# Patient Record
Sex: Female | Born: 1950 | Race: White | Hispanic: No | Smoking: Never smoker
Health system: Southern US, Community
[De-identification: ages and names within clinical notes are randomized; demographics above are authoritative.]

## PROBLEM LIST (undated history)

## (undated) DIAGNOSIS — I10 Essential (primary) hypertension: Secondary | ICD-10-CM

## (undated) DIAGNOSIS — K219 Gastro-esophageal reflux disease without esophagitis: Secondary | ICD-10-CM

## (undated) DIAGNOSIS — M205X1 Other deformities of toe(s) (acquired), right foot: Secondary | ICD-10-CM

## (undated) HISTORY — PX: NOSE SURGERY: SHX723

## (undated) HISTORY — PX: FOOT SURGERY: SHX648

## (undated) HISTORY — PX: ABDOMINAL HYSTERECTOMY: SHX81

## (undated) HISTORY — DX: Gastro-esophageal reflux disease without esophagitis: K21.9

## (undated) HISTORY — DX: Essential (primary) hypertension: I10

---

## 1997-10-22 ENCOUNTER — Other Ambulatory Visit: Admission: RE | Admit: 1997-10-22 | Discharge: 1997-10-22 | Payer: Self-pay | Admitting: Obstetrics and Gynecology

## 1998-11-25 ENCOUNTER — Other Ambulatory Visit: Admission: RE | Admit: 1998-11-25 | Discharge: 1998-11-25 | Payer: Self-pay | Admitting: Obstetrics and Gynecology

## 1999-11-08 ENCOUNTER — Other Ambulatory Visit: Admission: RE | Admit: 1999-11-08 | Discharge: 1999-11-08 | Payer: Self-pay | Admitting: Obstetrics and Gynecology

## 2000-03-18 ENCOUNTER — Encounter: Payer: Self-pay | Admitting: Gynecology

## 2000-03-20 ENCOUNTER — Inpatient Hospital Stay (HOSPITAL_COMMUNITY): Admission: RE | Admit: 2000-03-20 | Discharge: 2000-03-23 | Payer: Self-pay | Admitting: Obstetrics and Gynecology

## 2000-03-20 ENCOUNTER — Encounter (INDEPENDENT_AMBULATORY_CARE_PROVIDER_SITE_OTHER): Payer: Self-pay | Admitting: Specialist

## 2000-11-14 ENCOUNTER — Other Ambulatory Visit: Admission: RE | Admit: 2000-11-14 | Discharge: 2000-11-14 | Payer: Self-pay | Admitting: Obstetrics and Gynecology

## 2004-02-17 ENCOUNTER — Other Ambulatory Visit: Admission: RE | Admit: 2004-02-17 | Discharge: 2004-02-17 | Payer: Self-pay | Admitting: Obstetrics and Gynecology

## 2007-04-08 ENCOUNTER — Encounter: Admission: RE | Admit: 2007-04-08 | Discharge: 2007-04-08 | Payer: Self-pay | Admitting: Specialist

## 2009-07-05 ENCOUNTER — Encounter: Admission: RE | Admit: 2009-07-05 | Discharge: 2009-07-05 | Payer: Self-pay | Admitting: Obstetrics and Gynecology

## 2010-02-20 ENCOUNTER — Encounter: Payer: Self-pay | Admitting: Infectious Disease

## 2010-02-24 ENCOUNTER — Encounter (INDEPENDENT_AMBULATORY_CARE_PROVIDER_SITE_OTHER): Payer: Self-pay | Admitting: *Deleted

## 2010-03-13 ENCOUNTER — Ambulatory Visit: Payer: Self-pay | Admitting: Infectious Disease

## 2010-03-13 DIAGNOSIS — N951 Menopausal and female climacteric states: Secondary | ICD-10-CM | POA: Insufficient documentation

## 2010-03-13 DIAGNOSIS — IMO0001 Reserved for inherently not codable concepts without codable children: Secondary | ICD-10-CM

## 2010-03-13 LAB — CONVERTED CEMR LAB
HCV Ab: NEGATIVE
Hep A Total Ab: NEGATIVE
Hep B Core Total Ab: NEGATIVE
Hep B S Ab: NEGATIVE
Hepatitis B Surface Ag: NEGATIVE
TSH: 2.446 microintl units/mL (ref 0.350–4.500)

## 2010-04-19 ENCOUNTER — Encounter: Payer: Self-pay | Admitting: Infectious Disease

## 2010-05-15 ENCOUNTER — Ambulatory Visit: Payer: Self-pay | Admitting: Infectious Disease

## 2010-05-15 ENCOUNTER — Encounter: Payer: Self-pay | Admitting: Infectious Disease

## 2010-05-15 DIAGNOSIS — R002 Palpitations: Secondary | ICD-10-CM | POA: Insufficient documentation

## 2010-05-15 DIAGNOSIS — R5382 Chronic fatigue, unspecified: Secondary | ICD-10-CM | POA: Insufficient documentation

## 2010-05-15 DIAGNOSIS — I10 Essential (primary) hypertension: Secondary | ICD-10-CM | POA: Insufficient documentation

## 2010-05-15 DIAGNOSIS — A779 Spotted fever, unspecified: Secondary | ICD-10-CM | POA: Insufficient documentation

## 2010-05-19 ENCOUNTER — Encounter: Payer: Self-pay | Admitting: Infectious Disease

## 2010-05-19 LAB — CONVERTED CEMR LAB
Metaneph Total, Ur: 322 ug/24hr (ref 224–832)
Metanephrines, Ur: 78 — ABNORMAL LOW (ref 90–315)
Normetanephrine, 24H Ur: 244 (ref 122–676)

## 2010-06-12 ENCOUNTER — Telehealth: Payer: Self-pay | Admitting: Infectious Disease

## 2010-06-22 ENCOUNTER — Telehealth (INDEPENDENT_AMBULATORY_CARE_PROVIDER_SITE_OTHER): Payer: Self-pay | Admitting: Licensed Clinical Social Worker

## 2010-07-06 NOTE — Miscellaneous (Signed)
Summary: HIPAA Restrictions  HIPAA Restrictions   Imported By: Florinda Marker 03/14/2010 10:41:42  _____________________________________________________________________  External Attachment:    Type:   Image     Comment:   External Document

## 2010-07-06 NOTE — Consult Note (Signed)
Summary: [facility]   Imported By: Florinda Marker 05/15/2010 10:52:32  _____________________________________________________________________  External Attachment:    Type:   Image     Comment:   External Document

## 2010-07-06 NOTE — Consult Note (Signed)
Summary: [facility]   Imported By: Florinda Marker 06/15/2010 10:39:20  _____________________________________________________________________  External Attachment:    Type:   Image     Comment:   External Document

## 2010-07-06 NOTE — Miscellaneous (Signed)
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

## 2010-07-06 NOTE — Consult Note (Signed)
Summary: New Pt. Referral: [facility]  New Pt. Referral: [facility]   Imported By: Florinda Marker 03/16/2010 08:59:09  _____________________________________________________________________  External Attachment:    Type:   Image     Comment:   External Document

## 2010-07-06 NOTE — Progress Notes (Signed)
  Phone Note Call from Patient   Caller: Patient Call For: Paulette Blanch Dam MD Summary of Call: Patient called again wanting results of urine test, and would like labs and last office visit mailed to her home. Initial call taken by: Starleen Arms CMA,  June 22, 2010 12:54 PM  Follow-up for Phone Call        Spoke with patient and gave her results of urine test. Also printed office notes and labs to mail to her home. Follow-up by: Starleen Arms CMA,  June 23, 2010 11:05 AM

## 2010-07-06 NOTE — Assessment & Plan Note (Signed)
Summary: F/U OV/VS   Visit Type:  Follow-up Primary Provider:  Dr. Sherril Croon  CC:  f/u, tenderness in legs, and muscle tenderness .  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 Did have a fever at one time. She never got antibiotics or testing then. IN 2008 She did have flu like symptoms including fever 102.4. Husband was sick as well She was in  [place], [place] in APril 9. She was not treated with doxycyline but  later had testing for RMSF in 02/2009 and told she had evidence of past infection--OF NOTE THE RMSF IGG at that time was   negative. She also for what it is worth tested negative for Borrelia at that time. 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 This serology and hx of RMSF infection IN NO WAY explaines her chronic and current symptoms  She has seen Dr. Nickola Major who from what I can gather from her note suspects fibromyalgia. Today the patient spent considerable time reviewing her symptoms of hot flashes. THey occur intermittently and are often accompanied by palpitations. The patient in fact had some of these episodes during my interview with her especially when I [place] which she attributes as breing the focal point of her problems. She also describes problems losing her voice when she talks for protacted periods of teim. I spent well over 45 minutes  with this pt including >50% of time spent counselling the pt face to face.mouth pipetted (actidione). Pt has problems with voice getting lost, Pt is having ho flashes and sweats similar to her menopausal symptoms.   Problems Prior to Update: 1)  Hot Flashes  (ICD-627.2) 2)  Myalgia  (ICD-729.1) 3)  Tick Bite  (ICD-E906.4)  Medications Prior to Update: 1)  Denavir 1 % Crea  (Penciclovir) .... Apply Cream Four Times A Day For 30 Days 2)  Triamcinolone Acetonide 0.1 % Crea (Triamcinolone Acetonide) .... Apply Cream Two Times A Day For 30 Days 3)  Allegra Allergy 180 Mg Tabs (Fexofenadine Hcl) .... Take 1 Tablet By Mouth Once Daily For 31 Days 4)  Flonase 50 Mcg/act Susp (Fluticasone Propionate) .... 2 Puffs Once Daily Each Nostril For 30 Days 5)  Mobic 7.5 Mg Tabs (Meloxicam) .... Take 1 Tablet By Mouth Two Times A Day As Needed 6)  Premarin 0.45 Mg Tabs (Estrogens Conjugated) .... Take One Tablet By Mouth Daily  Current Medications (verified): 1)  Denavir 1 % Crea (Penciclovir) .... Apply Cream Four Times A Day For 30 Days 2)  Triamcinolone Acetonide 0.1 % Crea (Triamcinolone Acetonide) .... Apply Cream Two Times A Day For 30 Days 3)  Allegra Allergy 180 Mg Tabs (Fexofenadine Hcl) .... Take 1 Tablet By Mouth Once Daily For 31 Days 4)  Flonase 50 Mcg/act Susp (Fluticasone Propionate) .... 2 Puffs Once Daily Each Nostril For 30 Days 5)  Mobic 7.5 Mg Tabs (Meloxicam) .... Take 1 Tablet By Mouth Two Times A Day As Needed 6)  Premarin 0.45 Mg Tabs (Estrogens Conjugated) .... Take One Tablet By Mouth Daily 7)  Doxycycline Hyclate 100 Mg Tabs (Doxycycline Hyclate) .... Take 1 Tablet By Mouth Two Times A Day For 7 Days For Suspected Rmsf Episode  Allergies: 1)  ! Ibuprofen  Current Allergies (reviewed today): ! 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. Her husband drinks hard liquor every day and has evidence of hepatitis by labs.  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    Vital Signs:  Patient profile:   60 year old female Height:      66 inches (167.64 cm) Weight:      143.25 pounds (65.11 kg) BMI:     23.20 Temp:     97.6 degrees F (36.44 degrees C) oral Pulse rate:   78 / minute BP sitting:   163 / 90  (right arm)  Vitals Entered By: Starleen Arms CMA (May 15, 2010 2:29 PM) CC: f/u, tenderness in legs, muscle tenderness  Is Patient Diabetic? No Nutritional Status BMI of 19 -24 = normal Nutritional Status Detail nl  Does patient need assistance? Functional Status Self care Ambulation Normal   Physical Exam  General:  alert, well-developed, and well-nourished.   Head:  normocephalic, atraumatic, and no abnormalities observed.   Eyes:  vision grossly intact, pupils equal, and pupils round.   Ears:  no external deformities and ear piercing(s) noted.   Nose:  no external deformity and no external erythema.   Mouth:  pharynx pink and moist and no erythema.   Neck:  supple and full ROM.   Lungs:  normal respiratory effort, no crackles, and no wheezes.   Heart:  normal rate, regular rhythm, no murmur, no gallop, and no rub.   Abdomen:  soft, non-tender, normal bowel sounds, and no distention.   Msk:  no joint tenderness, no joint swelling, and no joint warmth.  she is tender to palpation of quadriceps muscles,  Extremities:  No clubbing, cyanosis, edema, or deformity  noted with normal full range of motion of all joints.   Neurologic:  alert & oriented X3, sensation intact to pinprick, and gait normal.   Skin:  turgor normal, color normal, and no rashes.   Psych:  Oriented X3, memory intact for recent and remote, depressed affect, and moderately anxious.     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 It is possible that she has more than one infeciton with RMSF BUT RMSF DOES NOT EXPLAIN HER CHRONIC SYMPTOMS AT ALL. I wrote rx for doxyclycline that she can fill when she is in [place] she have symptoms that are truly suggesive of acute RMSF . I was explicit as I could be on how this would present. I hope this rx is not misnderstood as validation that her chronic ssx are due to RMSF at all  Orders: Est. Patient Level V (98119)  Problem # 2:  PALPITATIONS, RECURRENT (ICD-785.1)  will check 24 hr urine metanerphines. More likley explanation is that she is suffering anxiety and depression Orders: T-Metanephrines, Plasma [phone]) T-Urine 24 Hr. Metanephrines [phone]) Est. Patient Level V (29562)  Problem # 3:  HOT FLASHES (ICD-627.2)  see above discussion Her updated medication list for this problem includes:    Premarin 0.45 Mg Tabs (Estrogens conjugated) .Marland Kitchen... Take one tablet by mouth daily  Orders: Est. Patient Level V (13086)  Problem # 4:  MYALGIA (ICD-729.1)  I think fibromyalgia is the most likely explanation of her chronic myalgias . She may also have chronic fatigue and despite denying depresion likley suffers from depression with somatic complaints Her updated medication list for this problem includes:    Mobic 7.5 Mg Tabs (Meloxicam) .Marland Kitchen... Take 1 tablet by mouth two times a day as needed  Orders: Est. Patient Level V (57846)  Problem # 5:  FIBROMYALGIA (ICD-729.1)  see above Her updated medication list for this  problem includes:    Mobic 7.5 Mg Tabs (Meloxicam) .Marland Kitchen... Take 1 tablet by moutsee h two times a day as needed  Orders: Est. 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 Patient Level V (28413)  Problem # 7:  ESSENTIAL HYPERTENSION (ICD-401.9)  will check 24 urine metaneprhine test but expect that this is more related to her anxiety, depression  Orders: Est. Patient Level V (24401)  Medications Added to Medication List This Visit: 1)  Doxycycline Hyclate 100 Mg Tabs (Doxycycline hyclate) .... Take 1 tablet by mouth two times a day for 7 days for suspected rmsf episode  Patient Instructions: 1)  we will check a 24 hr urine metanephrine test and a serum metanephrine test 2)  [place] episode concerning for acute Rocky Moutnain Spooted Fever 3)  (symptoms of unexplained  high fever >101, headache,  with or without muscle aches) 4)  [facility] is a good group of ENT MDs that might be helpful for your problems with your voice Prescriptions: DOXYCYCLINE HYCLATE 100 MG TABS (DOXYCYCLINE HYCLATE) Take 1 tablet by mouth two times a day for 7 days for suspected RMSF episode  #20 x 3   Entered and Authorized by:   Acey Lav MD   Signed by:   Paulette Blanch Dam MD on 05/15/2010   Method used:   Electronically to        [id number] (retail)       [address]       [place], [place]  [address]       Ph: [phone] or [phone]       Fax: [phone]   RxID:   [id number]

## 2010-07-06 NOTE — Assessment & Plan Note (Signed)
Summary: new pt rmsf/   Visit Type:  Consult Primary Provider:  Dr. Sherril Croon  CC:  new patient rmsf.  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 Did have a fever at one time. She never got antibiotics or testing then. IN 2008 She did have flu like symptoms including fever 102.4. Husband was sick as well She was in  [place], [place] in APril 9. She was not treated with doxycyline but  later had testing for RMSF in 02/2009 and told she had evidence of past infection--OF NOTE THE RMSF IGG I have is negative from that date. She also for what it is worth tested negative for Borrelia at that time. She continues to get tick bites and pulls various ticks in various stages of life cycle from her body. She came in with a small plastic cup full of notes describing the ticks as well as preserved ticks in tape. She is under the impression that both she and her husband may have a tick borne illness (he is a separate referral today). She describes one episode of a salmon colored rash that she is convinced could have been lyme or other tick born lillness. She also went on to tell me of a terrible illness that their dog suffered that caused him to be paralyzed. Her night sweats, hot flashes have become more severe despite premarin therapy.  No weight loss.  She was exposed to uncle who had tb when she was a kid. e   She has not been seen by a rheumatologist but was seen by Orthopedics and esr, ana, rf were negative. She did again have flu like symptoms 2 weeks ago including a headache, sore throat, malaise. She continues have hot, sweating profusely. This resolved in the interim.   I spent well over an hour with this pt including >50% of time spent counselling the pt face to face.  Allergies (verified): 1)  !  Ibuprofen   Preventive Screening-Counseling & Management  Alcohol-Tobacco     Alcohol drinks/day: every 4-5 days     Alcohol type: wine     Smoking Status: never  Caffeine-Diet-Exercise     Caffeine use/day: coffee and hot tea     Does Patient Exercise: no     Type of exercise: active at work/outdoors  Safety-Violence-Falls     Seat Belt Use: yes   Current Allergies (reviewed today): ! 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. Her husband drinks hard liquor every day and has evidence of hepatitis by labs.Does Patient Exercise:  no Seat Belt Use:  yes Caffeine use/day:  coffee and hot tea Smoking Status:  never Tobacco Use:  no  Review of Systems       The patient complains of fever, weight gain, and muscle weakness.  The patient denies anorexia, weight loss, vision loss, decreased hearing, hoarseness, chest pain, syncope, dyspnea on exertion, peripheral edema, prolonged cough, headaches, hemoptysis, abdominal pain, melena, hematochezia, severe indigestion/heartburn, hematuria, incontinence, genital sores, suspicious skin lesions, transient blindness, difficulty walking, depression, unusual weight  change, abnormal bleeding, and enlarged lymph nodes.    Vital Signs:  Patient profile:   60 year old female Height:      66 inches (167.64 cm) Weight:      141.0 pounds (64.09 kg) BMI:     22.84 Temp:     97.4 degrees F (36.33 degrees C) oral Pulse rate:   71 / minute BP sitting:   122 / 79  (right arm)  Vitals Entered By: Baxter Hire) (March 13, 2010 11:02 AM) CC: new patient rmsf Pain Assessment Patient in pain? yes     Location: muscle pain Intensity: 1 Type: aching Onset of pain  pain comes and  goes Nutritional Status BMI of 19 -24 = normal Nutritional Status Detail appetite is okay per patient  Does patient need assistance? Functional Status Self care Ambulation Normal   Physical Exam  General:  alert, well-developed, and well-nourished.   Head:  normocephalic, atraumatic, and no abnormalities observed.   Eyes:  vision grossly intact, pupils equal, and pupils round.   Ears:  no external deformities and ear piercing(s) noted.   Nose:  no external deformity and no external erythema.   Mouth:  pharynx pink and moist and no erythema.   Neck:  supple and full ROM.   Lungs:  normal respiratory effort, no crackles, and no wheezes.   Heart:  normal rate, regular rhythm, no murmur, no gallop, and no rub.   Abdomen:  soft, non-tender, normal bowel sounds, and no distention.   Msk:  no joint tenderness, no joint swelling, and no joint warmth.  she is tender to palpation of quadriceps muscles, biceps muscles and in lower back. Not tender at all of the pressure points for fibromyalgia Extremities:  No clubbing, cyanosis, edema, or deformity noted with normal full range of motion of all joints.   Neurologic:  alert & oriented X3, sensation intact to pinprick, and gait normal.   Skin:  turgor normal, color normal, and no rashes.   Psych:  Oriented X3, memory intact for recent and remote, depressed affect, and moderately anxious.     Impression & Recommendations:  Problem # 1:  MYALGIA (ICD-729.1) I think she likely has fibromyalgia. I will refer her to Rheumatology because I think that she does deserve a formal rheum workup. Certainly may have psychiatric component as well. Not clearly a delusional parasitosis but her fixation on ticks is certainly peculiar. I will check her for viral hepatides and hiv today  Her updated medication list for this problem includes:    Mobic 7.5 Mg Tabs (Meloxicam) .Marland Kitchen... Take 1 tablet by mouth two times a day as needed  Orders: Rheumatology Referral  (Rheumatology) Consultation Level V [phone])  Problem # 2:  TICK BITE (ICD-E906.4) check rmsf and ehrlichia since pt is preocuppied with possiblity of having had these illnesses and in particular had ssx two weeks ago that possibly could have been either--though pt is now completely BETTER so wouldnt effect my management at all. I counselled pt tha tif she has acute symptoms of flu like illness with concern for RMSF or ehrlichia that she should get doxycylien acutely as test and diagnosis of illness and if she wants to be sure acute and convalescent titers could be checked. She has no clear cut evidence of having had rmsf or ehrlichia. Finally there is no chronic RMSF or ehrlichia or any chronic tick borne illness. (Lyme is sometimes rx for 2 months but there is no evidence tha tthe post lyme  arthritis is due to any ongoing infection Orders: T- * Misc. Laboratory test [phone]) T- * Misc. Laboratory test [phone]) Consultation Level V [phone])  Problem # 3:  HOT FLASHES (ICD-627.2) due to menopause. But will check tsh and check quantiferon gold, viral hepatitis studies and hiv ab Her updated medication list for this problem includes:    Premarin 0.45 Mg Tabs (Estrogens conjugated) .Marland Kitchen... Take one tablet by mouth daily  Orders: T-TSH ([id number]) T- * Misc. Laboratory test [phone]) T-HIV Antibody  (Reflex) [phone]) T-Hepatitis A Antibody ([id number]) T-Hepatitis B Core Antibody ([id number]) T-Hepatitis B Surface Antibody ([id number]) T-Hepatitis B Surface Antigen ([id number]) T-Hepatitis C Antibody ([id number]) Consultation Level V (42706)  Medications Added to Medication List This Visit: 1)  Premarin 0.45 Mg Tabs (Estrogens conjugated) .... Take one tablet by mouth daily  Patient Instructions: 1)  RTC in December

## 2010-07-06 NOTE — Progress Notes (Addendum)
Summary: lab results  Phone Note Call from Patient   Caller: Patient Summary of Call: Pt requesting results form 24 hour urine.  Tomasita Morrow RN  June 12, 2010 11:05 AM  Initial call taken by: Tomasita Morrow RN,  June 12, 2010 11:05 AM  Follow-up for Phone Call        They did not show evidence for pheochromocytoma. She can put that concern behind her. Follow-up by: Acey Lav MD,  June 12, 2010 12:35 PM

## 2010-07-17 ENCOUNTER — Encounter (INDEPENDENT_AMBULATORY_CARE_PROVIDER_SITE_OTHER): Payer: Self-pay | Admitting: Licensed Clinical Social Worker

## 2010-07-17 DIAGNOSIS — K730 Chronic persistent hepatitis, not elsewhere classified: Secondary | ICD-10-CM | POA: Insufficient documentation

## 2010-07-26 NOTE — Miscellaneous (Signed)
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 

## 2010-10-12 IMAGING — CT CT ABD-PELV W/O CM
2 of 4 series · 13 of 32 positions shown, 18 images · non-contrast
Comparison: [HOSPITAL] lumbar spine MRI 04/08/2007.

CLINICAL DATA: Intermittent urinary frequency, dysuria,
microhematuria since 05/28/2009.  TAH / BSO.

CT ABDOMEN AND PELVIS WITHOUT CONTRAST
TECHNIQUE: Multidetector CT imaging of the abdomen and pelvis was
performed following the standard protocol without intravenous
contrast.

[Series 2: renal stone w/o · axial · non-contrast · 0.70mm/px · z∈[-335,-80]mm · 5 of 77 slices shown, 10 images]
[im 13/77  soft-tissue]
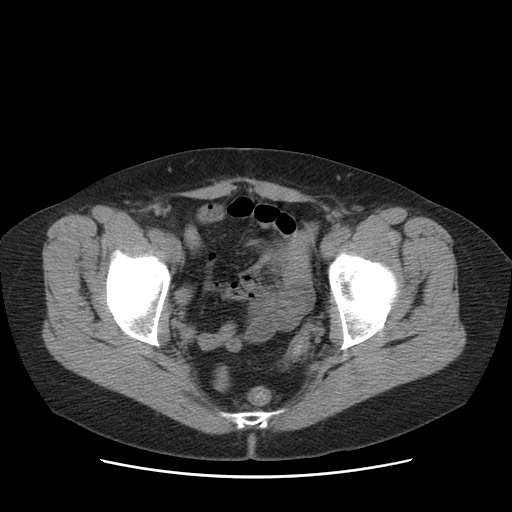
[im 13/77  bone]
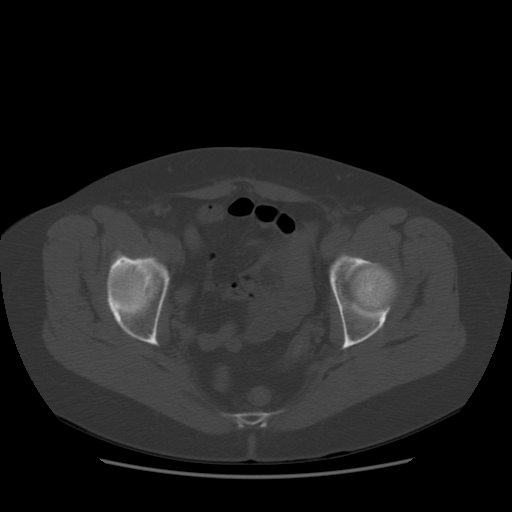
[im 26/77  soft-tissue]
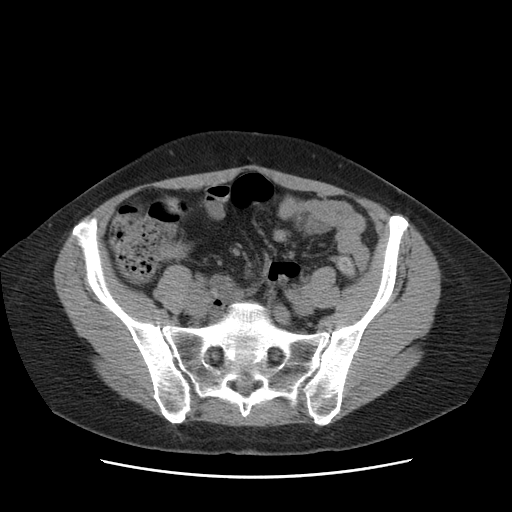
[im 26/77  lung]
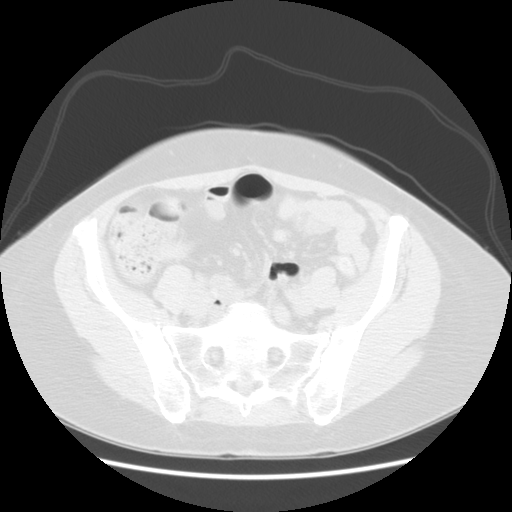
[im 39/77  soft-tissue]
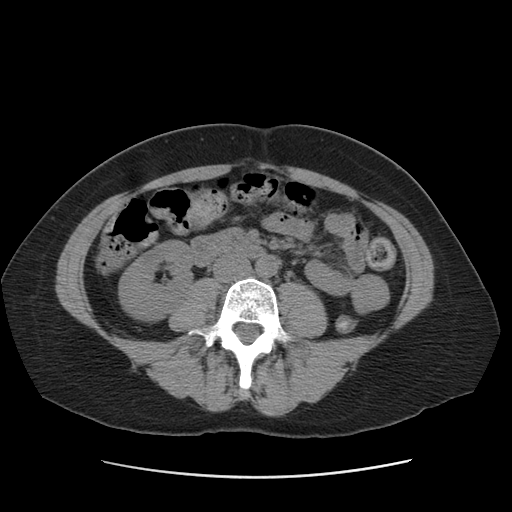
[im 39/77  lung]
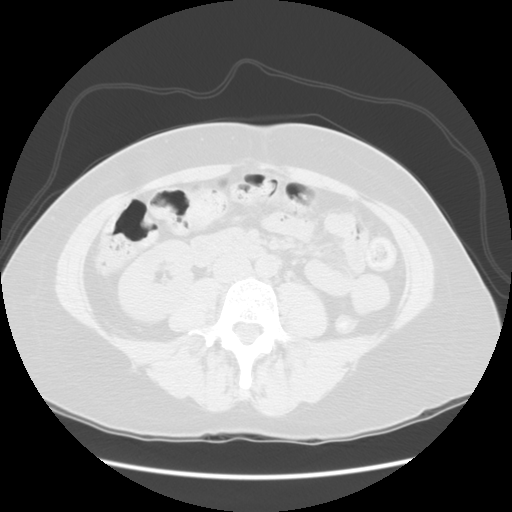
[im 51/77  soft-tissue]
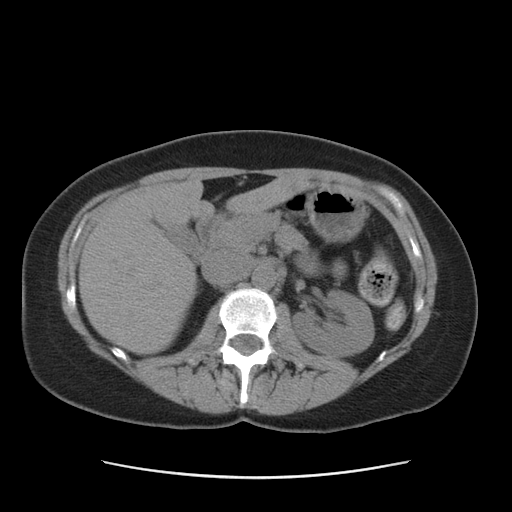
[im 51/77  lung]
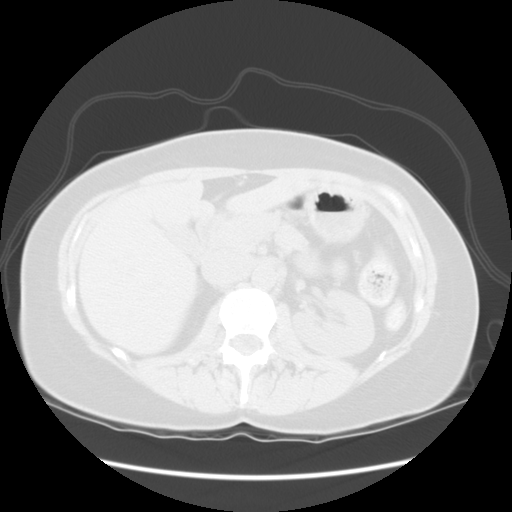
[im 64/77  soft-tissue]
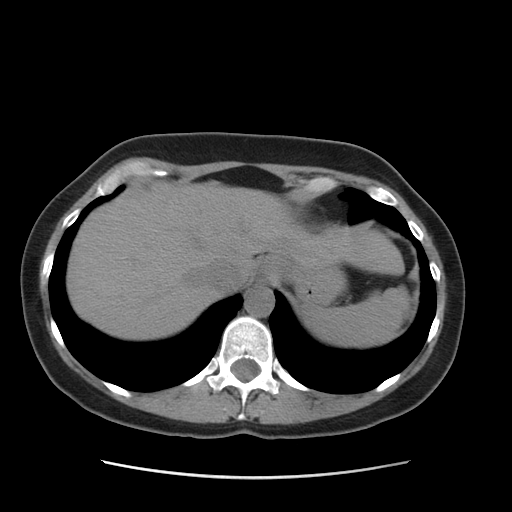
[im 64/77  lung]
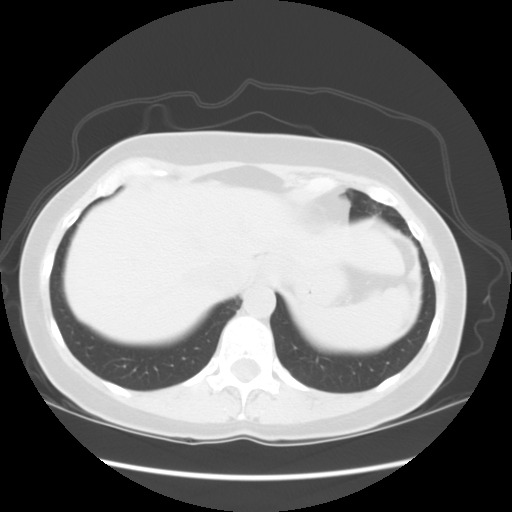

[Series 400: sagittal · sagittal · 0.85mm/px · 8 of 142 slices shown]
[im 12/142  soft-tissue]
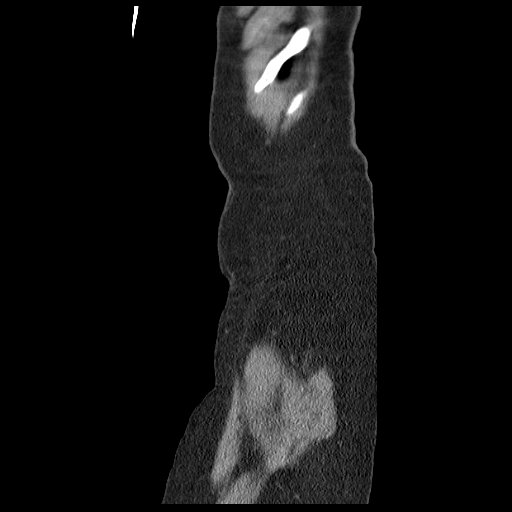
[im 36/142  soft-tissue]
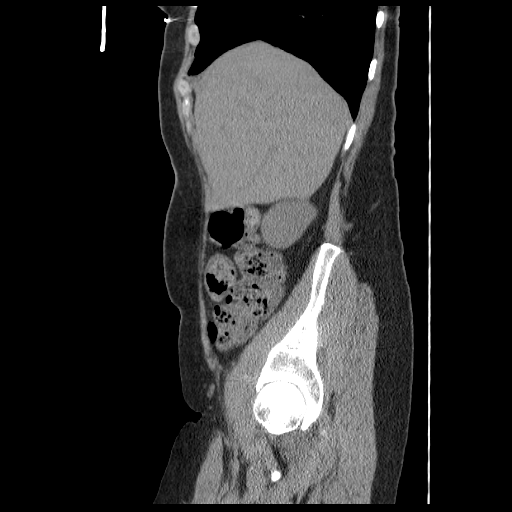
[im 48/142  soft-tissue]
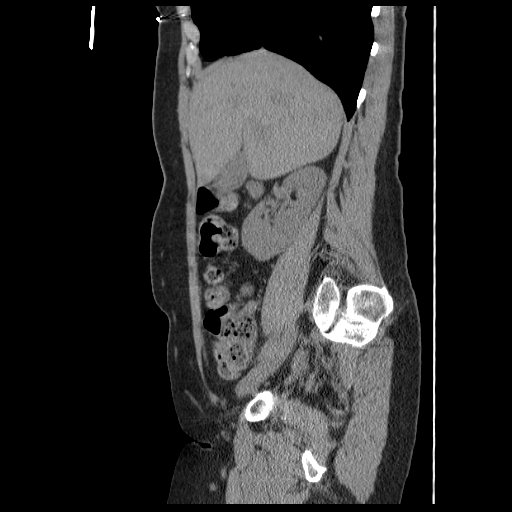
[im 59/142  soft-tissue]
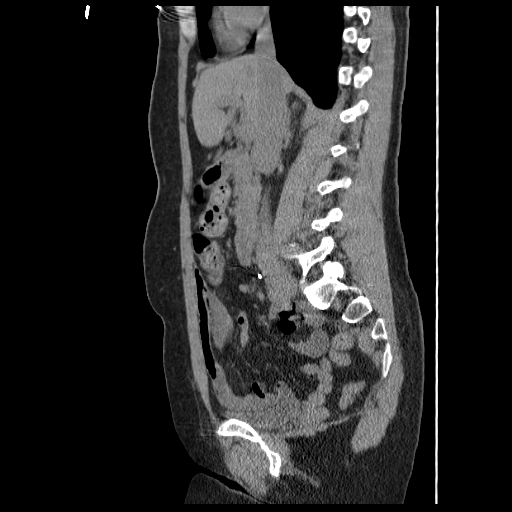
[im 83/142  soft-tissue]
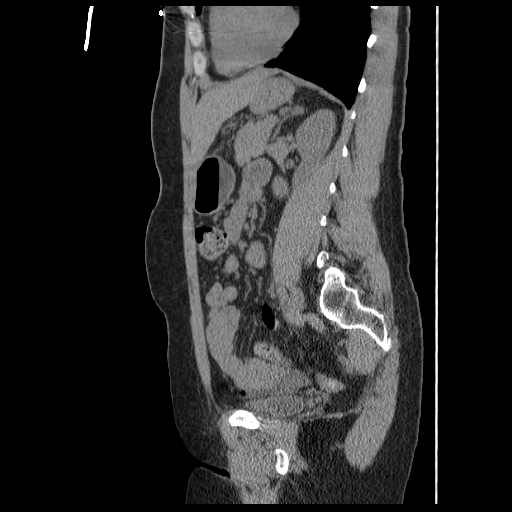
[im 95/142  soft-tissue]
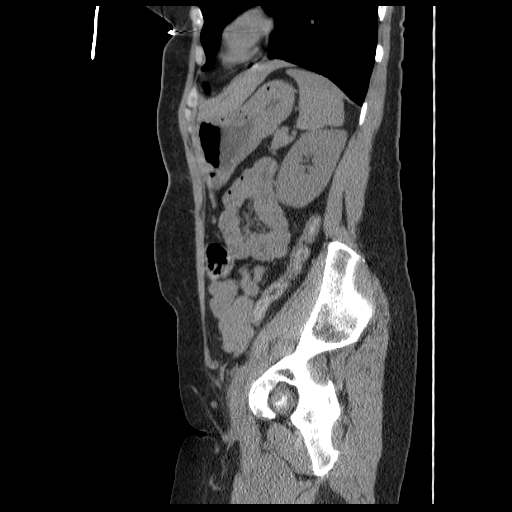
[im 106/142  soft-tissue]
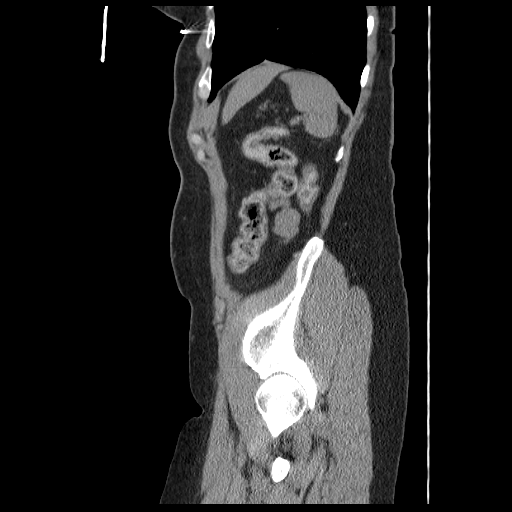
[im 130/142  soft-tissue]
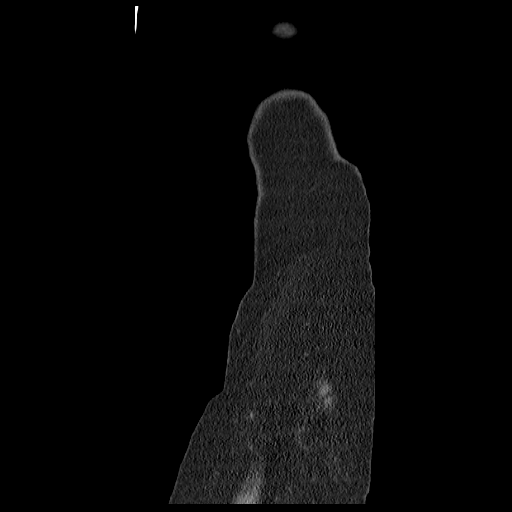

[13 of 32 positions shown; findings below may reference images not displayed]

FINDINGS: Slight congenital malrotation right kidney with upper
urinary tracts appearing normal.  No evidence for urinary tract
calculus or hydronephrosis seen.  Right pelvic retroperitoneal
surgical clips seen.  Distal urinary tract appears normal.  Lung
bases are clear.  Remaining abdominal organs appear normal without
inflammation, free fluid or adenopathy.  The patient is post
hysterectomy bilateral oophorectomy by history and CT exam.  Stable
slight degenerative disc space narrowing L4-5 noted.
IMPRESSION: 1.  Post hysterectomy and bilateral oophorectomy.
2.  Stable slight degenerative disc space narrowing L4-5.
3.  Otherwise, negative - specifically negative urinary tract
(slight congenital malrotation right kidney).

## 2010-10-20 NOTE — H&P (Signed)
[facility]  Patient:    [name], [name]                    MRN: [id number] Adm. Date:  [id number] Attending:  Miguel Aschoff CC:         Rande Brunt. Clarke-Pearson, M.D.   History and Physical  ADMISSION DIAGNOSES 1. Complex right adnexal mass. 2. Abdominal pain.  HISTORY:  The patient is a 60 year old white female, gravida 0, para 0, who was in her usual state of good health when last seen for routine physical examination in June of 2001.  In early October, the patient had been admitted to [facility] for sudden onset of severe lower abdominal pain associated with fever.  She had a CAT scan performed at that time which revealed the presence of a 15- to 18-cm complex ovarian mass; no ascites was noted at that time.  The patient, at that time, was febrile and was started on antibiotic therapy.  Her fever did resolve but her abdominal pain persisted, as well as the mass.  In view of the complex nature of the findings, it was felt that this may represent an ovarian neoplasm and it was felt that the patient needed to undergo exploratory laparotomy with the assistance of a GYN oncologist.  After being discharged from [facility], arrangements were made to admit the patient to [facility] and she is admitted at this time to undergo exploratory laparotomy.  Patient did have a CA125 level drawn, which was 11.1, which was within normal limits.  A CT scan did not reveal any evidence of ascites or adenopathy.  There did not appear to be any ureteral obstruction.  Her last Pap smear done in June of 2001 was satisfactory and within normal limits.  The admission diagnosis therefore was complex left adnexal mass -- possible ovarian neoplasm.  PAST MEDICAL HISTORY:  Patient denies any history of high blood pressure, diabetes, heart disease or asthma.  PAST SURGICAL HISTORY:  Positive for nasal septoplasty.  ALLERGIES:  Allergic history is  negative.  MEDICATIONS:  Medications at the current time include vitamin E taken for breast soreness.  FAMILY HISTORY:  Noncontributory.  SOCIAL HISTORY:  The patient is married and denies the use of alcohol, drugs or tobacco.  REVIEW OF SYSTEMS:  Review of the head, eyes, ears, nose and throat is negative.  Cardiorespiratory review is negative for shortness of breath, cough or hemoptysis.  GI review is negative for nausea, vomiting, diarrhea, melena or hematochezia.  GU review is negative for dysuria, frequency or urgency. GYN review reveals the patient to have occasional irregular cycle.  She has had some associated hot flashes and night sweats.  PHYSICAL EXAMINATION  GENERAL:  Patient is a well-developed, well-nourished white female in moderate abdominal distress but without an acute abdomen.  Patient is afebrile.  VITAL SIGNS:  Temperature 97.4, pulse 96, respirations 20, blood pressure 102/68.  Weight 128 pounds.  Height 65 inches.  HEENT:  Head is normocephalic and atraumatic.  Eyes show pupils to be equal to light and accommodation.  Sclerae are white.  Conjunctivae are pink. Extraocular muscles with full range of motion.  Nose patent without gross lesion.  Tongue is moist and in the midline.  NECK:  Supple.  There is no adenopathy or jugular venous distention noted.  No thyromegaly is noted.  HEART:  Regular rhythm with no murmur or gallop.  BREASTS:  Nontender.  No masses are found.  ABDOMEN:  Soft but tender in the lower abdomen and in the lower abdomen, there is a large mass extending up to approximately 4 cm of the umbilicus, filling the mid-abdomen and right lower quadrant that is exquisitely tender.  Bowel sounds are active.  BACK:  Back reveals no CVA tenderness or deformities.  EXTREMITIES:  No clubbing, cyanosis, or edema.  PELVIC:  Examination reveals normal external genitalia, normal Bartholins and Skene glands, normal urethra.  Vaginal vault is  without gross lesion.  Cervix is without gross lesion.  Uterus appears to be anterior and is tender, filling the cul-de-sac and the entire pelvis.  There is an exquisitely tender large mass approximately 14 to 15 cm in size.  Rectovaginal exam is confirmatory.  NEUROLOGIC:  Exam is grossly intact.  IMPRESSION:  Complex right adnexal mass, cannot rule out ovarian carcinoma.  PLAN:  The plan is for the patient to undergo exploratory laparotomy and probable TAH/BSO. DD:  03/20/00 TD:  03/20/00 Job: [id number]

## 2010-10-20 NOTE — H&P (Signed)
[facility]  Patient:    [name], [name]                    MRN: [id number] Adm. Date:  [id number] Attending:  Miguel Aschoff                         History and Physical  No dictation. DD:  03/20/00 TD:  03/20/00 Job: [id number]

## 2010-10-20 NOTE — Op Note (Signed)
[facility]  Patient:    [name], [name]                    MRN: [id number] Proc. Date: 03/20/00 Adm. Date:  [id number] Disc. Date: [id number] Attending:  Miguel Aschoff CC:         Miguel Aschoff, M.D.  Telford Nab, R.N.   Operative Report  PREOPERATIVE DIAGNOSIS:  Right adnexal mass with chronic pain and fever.  POSTOPERATIVE DIAGNOSIS:  Right tubo-ovarian abscess with hydrosalpinx, retroperitoneal fibrosis.  PROCEDURE:  Exploratory laparotomy, total abdominal hysterectomy, bilateral salpingo-oophorectomy, right ureterolysis.  SURGEON:  Daniel L. Clarke-Pearson, M.D.  ASSISTANTS:  Miguel Aschoff, M.D., Telford Nab, R.N.  ANESTHESIA:  General through oral endotracheal tube.  ESTIMATED BLOOD LOSS:  200 cc.  SURGICAL FINDINGS:  AT exploratory laparotomy, the upper abdomen was entirely normal.  The right fallopian tube was massively dilated with a large hydrosalpinx measuring approximately 10 cm in diameter.  The right ovary was edematous, but otherwise appeared relatively normal.  There was no evidence of purulent material in the pelvis.  There were adhesions making the mass adherent to the sigmoid mesentry and posterior cul-de-sac.  The left tube and ovary had a hydrosalpinx and chronic adhesions around it.  The uterus was otherwise normal.  DESCRIPTION OF PROCEDURE:  The patient was brought to the operating room and after attainment of general anesthesia was placed in a modified lithotomy position in Vilas stirrups.  The anterior wall, peritoneum and vagina were prepped with Betadine, a Foley catheter was placed, and the patient was draped.  The abdomen was entered through a midline incision and peritoneal washings were obtained.  The abdomen and pelvis were explored with the above- noted findings.  A Bookwalter retractor was assembled, exposing the pelvis.  In order to approach the large mass which was adherent in the deep pelvis  the retroperitoneal space on the right side was opened, identifying the external iliac artery, internal iliac artery, ureter, and ovarian vessels.  The ovarian vessels were skeletonized, clamped, cut, free-tied, and suture ligated.  In order to free the ureter away from the peritoneum and the attachments to the mass, ureterolysis was performed using sharp and blunt dissection.  The bladder flap was advanced off the cervix.  Using sharp and blunt dissection, the mass was dissected free from its attachments deep in the pelvic cul-de-sac.  Once this was elevated, the peritoneum beneath the mass was incised and the tube was cross-clamped and divided, removing the tube and ovary from the operative field which was submitted to pathology for frozen section with the above-noted findings.  Attention was turned to the left side wall where the ovarian vessels were isolated, clamped, cut, suture ligated, and free-tied.  The round ligament was also divided.  The bladder flap was further advanced and the uterine vessels skeletonized. These were clamped cut and suture ligated.  In a step-wise fashion, the paracervical and cardinal ligaments were clamped, cut and suture ligated.  The peritoneum overlying the rectovaginal septum was incised, developing the rectovaginal septum.  The vaginal angles were cross clamped and divided, and the vagina transected from its connection to the cervix.  The vaginal angles were transfixed with #0 Vicryl and the central portion of the vagina was closed with interrupted figure-of-eight sutures of #0 Vicryl. Additional hemostasis in the deep cul-de-sac was achieved with electrocautery.  All vascular pedicles were reinspected and found to be hemostatic.  The pelvis was irrigated with copious amounts of warm saline.  The packs and retractors were removed and the anterior abdominal wall were closed in layers, the first being a running Smead-Jones closure using #1 PDS.  The  subcutaneous tissue was irrigated.  Hemostasis was achieved with cautery and the skin was closed with skin staples.  A dressing was applied.  The patient was awakened from anesthesia and taken to the recovery room in satisfactory condition.  Sponge, needle and instrument counts correct x 2. DD:  03/20/00 TD:  03/20/00 Job: [id number]

## 2010-10-20 NOTE — Discharge Summary (Signed)
[facility]  Patient:    [name], [name]                    MRN: [id number] Adm. Date:  [id number] Disc. Date: [id number] Attending:  Miguel Aschoff                           Discharge Summary  ADMISSION DIAGNOSIS:  Painful complex mass.  DISCHARGE DIAGNOSIS:  Painful complex mass, status post exploratory laparotomy, total abdominal hysterectomy, bilateral salpingo-oophorectomy, ureterolysis, and lysis of adhesions for inflammatory pelvic mass.  HOSPITAL COURSE:  The patient is a 60 year old female who presents with a complex pelvic mass which is symptomatic.  The patient went to the operating room and underwent exploratory laparotomy, total abdominal hysterectomy, bilateral salpingo-oophorectomy, and ureterolysis, and lysis of adhesions.  An inflammatory mass involving the right tube and ovary was found at the time of surgery.  The patients estimated blood loss was 200 cc.  The patient did very well postoperatively.  By postoperative day #4 was ambulating, tolerating a regular diet, had flatus, and had been voiding without any dysfunction.  She remained afebrile with stable vital signs throughout her admission.  She was discharged home on postoperative day #4 in good condition.  DISCHARGE MEDICATIONS: 1. Motrin. 2. Prescription of Tylox one or two q.4-6h. p.r.n. pain.  FOLLOWUP:  She was advised to follow up in 4 days in the office for staple removal.  ACTIVITY:  She was advised no driving for 3 weeks, no heavy lifting.  Call the doctor if she has a temperature greater than 100.5, nausea, vomiting, increased abdominal pain. DD:  06/13/00 TD:  06/13/00 Job: [id number]

## 2010-11-15 ENCOUNTER — Telehealth: Payer: Self-pay | Admitting: *Deleted

## 2010-11-15 NOTE — Telephone Encounter (Signed)
States md wrote a letter for him so the labs were paid but wife has an outstanding bill of about $700. He needs md to write something justifying the tests & giving the correct codes. He has spoken to several people & is irate. [employer] told him the tests were unneccsary. [facility] told him the codes were wrong. Told him I will send this to md & will call him when md responds His # is [phone]

## 2010-11-20 ENCOUNTER — Telehealth: Payer: Self-pay | Admitting: *Deleted

## 2010-11-20 ENCOUNTER — Encounter: Payer: Self-pay | Admitting: Infectious Disease

## 2010-11-20 NOTE — Telephone Encounter (Signed)
Patient called as a follow-up to a call she adv was made to her in reference to a letter she needed to go to her insurance company explaining her need for the test in question. She advised that there is a letter going out on her husbands behalf and she also need a letter the same will be just fine because the insurance is refusing to pay because they say she asked for the test not the doctor. Advised her I would let the doctor know what she needs.

## 2010-11-20 NOTE — Telephone Encounter (Signed)
I have written the requested letter

## 2018-01-29 ENCOUNTER — Ambulatory Visit: Payer: Self-pay | Admitting: Podiatry

## 2018-01-31 ENCOUNTER — Ambulatory Visit (INDEPENDENT_AMBULATORY_CARE_PROVIDER_SITE_OTHER): Payer: BLUE CROSS/BLUE SHIELD

## 2018-01-31 ENCOUNTER — Ambulatory Visit (INDEPENDENT_AMBULATORY_CARE_PROVIDER_SITE_OTHER): Payer: BLUE CROSS/BLUE SHIELD | Admitting: Podiatry

## 2018-01-31 ENCOUNTER — Encounter: Payer: Self-pay | Admitting: Podiatry

## 2018-01-31 DIAGNOSIS — M2041 Other hammer toe(s) (acquired), right foot: Secondary | ICD-10-CM | POA: Diagnosis not present

## 2018-01-31 DIAGNOSIS — M2042 Other hammer toe(s) (acquired), left foot: Secondary | ICD-10-CM

## 2018-01-31 DIAGNOSIS — M2040 Other hammer toe(s) (acquired), unspecified foot: Secondary | ICD-10-CM | POA: Diagnosis not present

## 2018-01-31 DIAGNOSIS — M722 Plantar fascial fibromatosis: Secondary | ICD-10-CM | POA: Diagnosis not present

## 2018-01-31 DIAGNOSIS — M779 Enthesopathy, unspecified: Secondary | ICD-10-CM | POA: Diagnosis not present

## 2018-01-31 MED ORDER — TRIAMCINOLONE ACETONIDE 10 MG/ML IJ SUSP
10.0000 mg | Freq: Once | INTRAMUSCULAR | Status: AC
  Administered 2018-01-31: 10 mg

## 2018-01-31 NOTE — Patient Instructions (Signed)
Hammer Toe Hammer toe is a change in the shape (a deformity) of your second, third, or fourth toe. The deformity causes the middle joint of your toe to stay bent. This causes pain, especially when you are wearing shoes. Hammer toe starts gradually. At first, the toe can be straightened. Gradually over time, the deformity becomes stiff and permanent. Early treatments to keep the toe straight may relieve pain. As the deformity becomes stiff and permanent, surgery may be needed to straighten the toe. What are the causes? Hammer toe is caused by abnormal bending of the toe joint that is closest to your foot. It happens gradually over time. This pulls on the muscles and connections (tendons) of the toe joint, making them weak and stiff. It is often related to wearing shoes that are too short or narrow and do not let your toes straighten. What increases the risk? You may be at greater risk for hammer toe if you:  Are female.  Are older.  Wear shoes that are too small.  Wear high-heeled shoes that pinch your toes.  Are a ballet dancer.  Have a second toe that is longer than your big toe (first toe).  Injure your foot or toe.  Have arthritis.  Have a family history of hammer toe.  Have a nerve or muscle disorder.  What are the signs or symptoms? The main symptoms of this condition are pain and deformity of the toe. The pain is worse when wearing shoes, walking, or running. Other symptoms may include:  Corns or calluses over the bent part of the toe or between the toes.  Redness and a burning feeling on the toe.  An open sore that forms on the top of the toe.  Not being able to straighten the toe.  How is this diagnosed? This condition is diagnosed based on your symptoms and a physical exam. During the exam, your health care provider will try to straighten your toe to see how stiff the deformity is. You may also have tests, such as:  A blood test to check for rheumatoid  arthritis.  An X-ray to show how severe the deformity is.  How is this treated? Treatment for this condition will depend on how stiff the deformity is. Surgery is often needed. However, sometimes a hammer toe can be straightened without surgery. Treatments that do not involve surgery include:  Taping the toe into a straightened position.  Using pads and cushions to protect the toe (orthotics).  Wearing shoes that provide enough room for the toes.  Doing toe-stretching exercises at home.  Taking an NSAID to reduce pain and swelling.  If these treatments do not help or the toe cannot be straightened, surgery is the next option. The most common surgeries used to straighten a hammer toe include:  Arthroplasty. In this procedure, part of the joint is removed, and that allows the toe to straighten.  Fusion. In this procedure, cartilage between the two bones of the joint is taken out and the bones are fused together into one longer bone.  Implantation. In this procedure, part of the bone is removed and replaced with an implant to let the toe move again.  Flexor tendon transfer. In this procedure, the tendons that curl the toes down (flexor tendons) are repositioned.  Follow these instructions at home:  Take over-the-counter and prescription medicines only as told by your health care provider.  Do toe straightening and stretching exercises as told by your health care provider.  Keep all   follow-up visits as told by your health care provider. This is important. How is this prevented?  Wear shoes that give your toes enough room and do not cause pain.  Do not wear high-heeled shoes. Contact a health care provider if:  Your pain gets worse.  Your toe becomes red or swollen.  You develop an open sore on your toe. This information is not intended to replace advice given to you by your health care provider. Make sure you discuss any questions you have with your health care  provider. Document Released: 05/18/2000 Document Revised: 12/09/2015 Document Reviewed: 09/14/2015 Elsevier Interactive Patient Education  2018 Elsevier Inc.  

## 2018-01-31 NOTE — Progress Notes (Signed)
   Subjective:    Patient ID: [name], female    DOB: [birth year], 67 y.o.   MRN: [id number]  HPI    Review of Systems  Respiratory: Positive for chest tightness.   All other systems reviewed and are negative.      Objective:   Physical Exam        Assessment & Plan:

## 2018-01-31 NOTE — Progress Notes (Signed)
Subjective:   Patient ID: [name], female   DOB: 67 y.o.   MRN: [id number]   HPI patient presents stating that she is having a lot of pain in her right second toe and her left heel.  Toes been bothering her for about 4 months and the heels been bothering her for several months.  Patient states that she does not remember injury she does not smoke and likes to be active and it hurts with activity   Review of Systems  All other systems reviewed and are negative.       Objective:  Physical Exam  Constitutional: She appears well-developed and well-nourished.  Cardiovascular: Intact distal pulses.  Pulmonary/Chest: Effort normal.  Musculoskeletal: Normal range of motion.  Neurological: She is alert.  Skin: Skin is warm.  Nursing note and vitals reviewed.   Neurovascular status found to be intact muscle strength is adequate range of motion within normal limits with patient found to have intense discomfort second metatarsal phalangeal joint right and in the right plantar heel with inflammation fluid around the medial band.  Patient does have mild elevation of the second toe also noted and was found to have good digital perfusion well oriented x3     Assessment:  Acute plantar fasciitis left with inflammatory capsulitis second MPJ right     Plan:  H&P conditions reviewed and today I did a proximal nerve block of the right aspirated the joint getting out a small amount of clear fluid and injected quarter cc dexamethasone Kenalog and then in injected the left plantar fascia 3 mg Kenalog 5 mg Xylocaine and applied fascial brace with instructions on supportive shoe gear.  Gave instructions on stretching exercises reappoint 2 weeks  X-rays indicate that there is small spur left no indications of arthritis or stress fracture

## 2018-02-13 ENCOUNTER — Ambulatory Visit (INDEPENDENT_AMBULATORY_CARE_PROVIDER_SITE_OTHER): Payer: BLUE CROSS/BLUE SHIELD | Admitting: Podiatry

## 2018-02-13 ENCOUNTER — Encounter: Payer: Self-pay | Admitting: Podiatry

## 2018-02-13 DIAGNOSIS — M722 Plantar fascial fibromatosis: Secondary | ICD-10-CM

## 2018-02-13 DIAGNOSIS — M779 Enthesopathy, unspecified: Secondary | ICD-10-CM

## 2018-02-13 NOTE — Progress Notes (Signed)
Subjective:   Patient ID: [name], female   DOB: 67 y.o.   MRN: [id number]   HPI Patient presents stating that the left heel is still mildly tender in the right foot still feels somewhat like a marble but overall is moving as far as the pain goes   ROS      Objective:  Physical Exam  Neurovascular status intact with patient's inflammation reduced but still present with discomfort still upon deep palpation     Assessment:  Inflammatory condition with capsulitis and plantar fasciitis left     Plan:  H&P condition reviewed and recommended long-term orthotics to try to support the arch and scanned for customized orthotics to reduce stress against the forefoot and to properly heal.  Patient will be seen back when returned

## 2018-02-14 ENCOUNTER — Ambulatory Visit: Payer: BLUE CROSS/BLUE SHIELD | Admitting: Podiatry

## 2018-02-25 ENCOUNTER — Encounter: Payer: Self-pay | Admitting: Podiatry

## 2018-02-25 NOTE — Progress Notes (Signed)
Patients medical records were placed up front to have postage added and to be mailed out tomorrow, Wednesday 25 September to patient per her request via signed medical records release form.

## 2018-03-06 ENCOUNTER — Ambulatory Visit: Payer: BLUE CROSS/BLUE SHIELD | Admitting: Orthotics

## 2018-03-06 DIAGNOSIS — M2040 Other hammer toe(s) (acquired), unspecified foot: Secondary | ICD-10-CM

## 2018-03-06 DIAGNOSIS — M722 Plantar fascial fibromatosis: Secondary | ICD-10-CM

## 2018-03-06 NOTE — Progress Notes (Signed)
Patient came in today to pick up custom made foot orthotics.  The goals were accomplished and the patient reported no dissatisfaction with said orthotics.  Patient was advised of breakin period and how to report any issues. 

## 2018-04-03 ENCOUNTER — Encounter: Payer: Self-pay | Admitting: Podiatry

## 2018-04-03 ENCOUNTER — Ambulatory Visit: Payer: Medicare PPO | Admitting: Orthotics

## 2018-04-03 ENCOUNTER — Ambulatory Visit (INDEPENDENT_AMBULATORY_CARE_PROVIDER_SITE_OTHER): Payer: Medicare PPO | Admitting: Podiatry

## 2018-04-03 DIAGNOSIS — M779 Enthesopathy, unspecified: Secondary | ICD-10-CM

## 2018-04-03 DIAGNOSIS — M2040 Other hammer toe(s) (acquired), unspecified foot: Secondary | ICD-10-CM

## 2018-04-03 DIAGNOSIS — M722 Plantar fascial fibromatosis: Secondary | ICD-10-CM | POA: Diagnosis not present

## 2018-04-03 NOTE — Progress Notes (Signed)
Subjective:   Patient ID: [name], female   DOB: 67 y.o.   MRN: [id number]   HPI Patient presents stating the left heel is feeling much better my orthotics are feeling better in the right second toe does feel a little bit unusual   ROS      Objective:  Physical Exam  Neurovascular status intact with patient's left plantar heel improved with mild discomfort noted but localized with the right second digit unable to find any area of distinct tenderness but the wound did seem to be discomfort third MPJ but it is localized     Assessment:  Improved fasciitis left with mild pain in the forefoot right around the third MPJ     Plan:  H&P discussed both problems and recommended continued physical therapy orthotics for the left and for the right trying rigid bottom shoes and if it does not get better we will consider injection in the next 4 weeks

## 2018-04-03 NOTE — Progress Notes (Signed)
Patient complained of left foot orthotic feeling as thought it had a "lump" in distal medial aspect of the same.  I ground out about 1/8 cork material and it seemed to please her fine.

## 2018-08-06 ENCOUNTER — Telehealth: Payer: Self-pay | Admitting: Podiatry

## 2018-08-06 NOTE — Telephone Encounter (Signed)
I informed pt that she could receive an injection up to 2 weeks after the previous injection and that we could schedule her to be seen in office, to begin ice therapy 3-4 times/day for 15-20 minutes/session and protect the skin from the ice with a light cloth, wear supportive athletic shoes like New Balance. Transferred pt to schedulers.

## 2018-08-06 NOTE — Telephone Encounter (Signed)
My plantar fasciitis has flared up in my left heel. I had an injection on 01/31/2018 and wanted to know how long I have to wait before I can get another injection. I will schedule an appointment once I know.

## 2018-08-14 ENCOUNTER — Ambulatory Visit: Payer: Medicare PPO | Admitting: Podiatry

## 2018-08-14 ENCOUNTER — Other Ambulatory Visit: Payer: Self-pay

## 2018-08-14 ENCOUNTER — Encounter: Payer: Self-pay | Admitting: Podiatry

## 2018-08-14 DIAGNOSIS — M722 Plantar fascial fibromatosis: Secondary | ICD-10-CM

## 2018-08-14 MED ORDER — TRIAMCINOLONE ACETONIDE 10 MG/ML IJ SUSP
10.0000 mg | Freq: Once | INTRAMUSCULAR | Status: AC
  Administered 2018-08-14: 10 mg

## 2018-08-15 ENCOUNTER — Ambulatory Visit: Payer: Medicare PPO | Admitting: Podiatry

## 2018-08-18 NOTE — Progress Notes (Signed)
Subjective:   Patient ID: [name], female   DOB: 68 y.o.   MRN: [id number]   HPI Patient states she has a lot of pain in the bottom of her left heel which is started back again recently   ROS      Objective:  Physical Exam  Neurovascular status intact with exquisite discomfort plantar aspect left     Assessment:  Plantar fasciitis left with inflammation fluid buildup     Plan:  Inject the plantar fascial left 3 mg Kenalog 5 mg Xylocaine and advised on physical therapy anti-inflammatories and supportive shoe.  Reappoint for [place] to recheck

## 2018-10-16 DIAGNOSIS — M722 Plantar fascial fibromatosis: Secondary | ICD-10-CM | POA: Diagnosis not present

## 2018-10-16 DIAGNOSIS — E559 Vitamin D deficiency, unspecified: Secondary | ICD-10-CM | POA: Diagnosis not present

## 2018-10-16 DIAGNOSIS — G43109 Migraine with aura, not intractable, without status migrainosus: Secondary | ICD-10-CM | POA: Diagnosis not present

## 2019-07-10 DIAGNOSIS — H2512 Age-related nuclear cataract, left eye: Secondary | ICD-10-CM | POA: Diagnosis not present

## 2019-07-10 DIAGNOSIS — Z961 Presence of intraocular lens: Secondary | ICD-10-CM | POA: Diagnosis not present

## 2019-07-10 DIAGNOSIS — G43109 Migraine with aura, not intractable, without status migrainosus: Secondary | ICD-10-CM | POA: Diagnosis not present

## 2019-07-10 DIAGNOSIS — H02831 Dermatochalasis of right upper eyelid: Secondary | ICD-10-CM | POA: Diagnosis not present

## 2019-07-14 DIAGNOSIS — Z1339 Encounter for screening examination for other mental health and behavioral disorders: Secondary | ICD-10-CM | POA: Diagnosis not present

## 2019-07-14 DIAGNOSIS — Z7189 Other specified counseling: Secondary | ICD-10-CM | POA: Diagnosis not present

## 2019-07-14 DIAGNOSIS — E7800 Pure hypercholesterolemia, unspecified: Secondary | ICD-10-CM | POA: Diagnosis not present

## 2019-07-14 DIAGNOSIS — Z79899 Other long term (current) drug therapy: Secondary | ICD-10-CM | POA: Diagnosis not present

## 2019-07-14 DIAGNOSIS — Z299 Encounter for prophylactic measures, unspecified: Secondary | ICD-10-CM | POA: Diagnosis not present

## 2019-07-14 DIAGNOSIS — Z0000 Encounter for general adult medical examination without abnormal findings: Secondary | ICD-10-CM | POA: Diagnosis not present

## 2019-07-14 DIAGNOSIS — Z1331 Encounter for screening for depression: Secondary | ICD-10-CM | POA: Diagnosis not present

## 2019-07-14 DIAGNOSIS — Z1211 Encounter for screening for malignant neoplasm of colon: Secondary | ICD-10-CM | POA: Diagnosis not present

## 2019-07-14 DIAGNOSIS — R5383 Other fatigue: Secondary | ICD-10-CM | POA: Diagnosis not present

## 2019-07-14 DIAGNOSIS — Z6825 Body mass index (BMI) 25.0-25.9, adult: Secondary | ICD-10-CM | POA: Diagnosis not present

## 2019-07-15 DIAGNOSIS — M2550 Pain in unspecified joint: Secondary | ICD-10-CM | POA: Diagnosis not present

## 2019-09-08 DIAGNOSIS — Z1231 Encounter for screening mammogram for malignant neoplasm of breast: Secondary | ICD-10-CM | POA: Diagnosis not present

## 2019-09-08 DIAGNOSIS — Z6825 Body mass index (BMI) 25.0-25.9, adult: Secondary | ICD-10-CM | POA: Diagnosis not present

## 2019-09-08 DIAGNOSIS — Z01419 Encounter for gynecological examination (general) (routine) without abnormal findings: Secondary | ICD-10-CM | POA: Diagnosis not present

## 2019-10-22 DIAGNOSIS — E7800 Pure hypercholesterolemia, unspecified: Secondary | ICD-10-CM | POA: Diagnosis not present

## 2019-10-22 DIAGNOSIS — B353 Tinea pedis: Secondary | ICD-10-CM | POA: Diagnosis not present

## 2019-10-22 DIAGNOSIS — Z299 Encounter for prophylactic measures, unspecified: Secondary | ICD-10-CM | POA: Diagnosis not present

## 2019-10-22 DIAGNOSIS — Z6826 Body mass index (BMI) 26.0-26.9, adult: Secondary | ICD-10-CM | POA: Diagnosis not present

## 2019-12-02 DIAGNOSIS — R5383 Other fatigue: Secondary | ICD-10-CM | POA: Diagnosis not present

## 2019-12-02 DIAGNOSIS — Z299 Encounter for prophylactic measures, unspecified: Secondary | ICD-10-CM | POA: Diagnosis not present

## 2019-12-02 DIAGNOSIS — J309 Allergic rhinitis, unspecified: Secondary | ICD-10-CM | POA: Diagnosis not present

## 2019-12-02 DIAGNOSIS — R194 Change in bowel habit: Secondary | ICD-10-CM | POA: Diagnosis not present

## 2019-12-02 DIAGNOSIS — R030 Elevated blood-pressure reading, without diagnosis of hypertension: Secondary | ICD-10-CM | POA: Diagnosis not present

## 2019-12-11 ENCOUNTER — Encounter: Payer: Self-pay | Admitting: Internal Medicine

## 2019-12-16 DIAGNOSIS — Z299 Encounter for prophylactic measures, unspecified: Secondary | ICD-10-CM | POA: Diagnosis not present

## 2019-12-16 DIAGNOSIS — W57XXXA Bitten or stung by nonvenomous insect and other nonvenomous arthropods, initial encounter: Secondary | ICD-10-CM | POA: Diagnosis not present

## 2019-12-16 DIAGNOSIS — R030 Elevated blood-pressure reading, without diagnosis of hypertension: Secondary | ICD-10-CM | POA: Diagnosis not present

## 2020-01-14 ENCOUNTER — Other Ambulatory Visit: Payer: Self-pay

## 2020-01-14 ENCOUNTER — Encounter: Payer: Self-pay | Admitting: Internal Medicine

## 2020-01-14 ENCOUNTER — Ambulatory Visit: Payer: Medicare PPO | Admitting: Internal Medicine

## 2020-01-14 VITALS — BP 152/84 | HR 72 | Temp 96.9°F | Ht 65.0 in | Wt 150.8 lb

## 2020-01-14 DIAGNOSIS — R1013 Epigastric pain: Secondary | ICD-10-CM | POA: Diagnosis not present

## 2020-01-14 DIAGNOSIS — R1319 Other dysphagia: Secondary | ICD-10-CM

## 2020-01-14 DIAGNOSIS — R1310 Dysphagia, unspecified: Secondary | ICD-10-CM | POA: Diagnosis not present

## 2020-01-14 DIAGNOSIS — Z1211 Encounter for screening for malignant neoplasm of colon: Secondary | ICD-10-CM

## 2020-01-14 DIAGNOSIS — K449 Diaphragmatic hernia without obstruction or gangrene: Secondary | ICD-10-CM | POA: Diagnosis not present

## 2020-01-14 NOTE — Progress Notes (Signed)
Primary Care Physician:  Ignatius Specking, MD Primary Gastroenterologist:  Dr. Marletta Lor  Chief Complaint  Patient presents with  . Hiatal Hernia  . Heartburn    has fullness/tingling feeling    HPI:   [name] is a 69 y.o. female who presents to the clinic today by referral from her PCP Dr. Sherril Croon for evaluation.  She has multiple GI complaints for me today.  She states she has a history of hiatal hernia diagnosed in 2006 via EGD.  She notes recently she has had worsening reflux, cough, hoarseness, and dysphagia primarily with solids.  She states she has taken a few 2-week courses of omeprazole which seems to help but then her symptoms returned.  Denies any chronic NSAID use.  Appears she used to be on Mobic.  No history of peptic ulcer disease or H. pylori that she is aware of.  Also states she is due for screening colonoscopy with last 1 years ago.  No history of polyps that she knows of.  No family history of colorectal malignancy.  Does note a history of gastric cancer in her father.  No melena hematochezia.  No unintentional weight loss.  Otherwise she has no other complaints.  Past Medical History:  Diagnosis Date  . GERD (gastroesophageal reflux disease)   . Hypertension     Past Surgical History:  Procedure Laterality Date  . ABDOMINAL HYSTERECTOMY      Current Outpatient Medications  Medication Sig Dispense Refill  . Apple Cider Vinegar 500 MG TABS Take by mouth.    . Ascorbic Acid (VITAMIN C) 500 MG CAPS Take by mouth.    Marland Kitchen b complex vitamins tablet Take 1 tablet by mouth.    . Biotin 1000 MCG tablet Take 1,000 mcg by mouth.    . Calcium Carbonate-Vitamin D (CALCIUM-VITAMIN D3 PO) Take by mouth.    . Multiple Vitamins-Minerals (PRESERVISION AREDS PO) Take by mouth.    Marland Kitchen omeprazole (PRILOSEC) 20 MG capsule Take 10 mg by mouth as needed.    . Prenatal Vit-Fe Fumarate-FA (PRENATAL PO) Take by mouth.    . vitamin E (VITAMIN E) 180 MG (400 UNITS) capsule Take 400 Units by  mouth daily.    Marland Kitchen zinc gluconate 50 MG tablet Take 50 mg by mouth.    . penciclovir (DENAVIR) 1 % cream Apply 1 application topically 4 (four) times daily. For 30 days      No current facility-administered medications for this visit.    Allergies as of 01/14/2020 - Review Complete 01/14/2020  Allergen Reaction Noted  . Ibuprofen  03/13/2010  . Nsaids  01/31/2018  . Propranolol  01/31/2018    Family History  Problem Relation Age of Onset  . Stomach cancer Father   . Diverticulitis Sister   . Heart disease Brother     Social History   Socioeconomic History  . Marital status: Married    Spouse name: Not on file  . Number of children: Not on file  . Years of education: Not on file  . Highest education level: Not on file  Occupational History  . Not on file  Tobacco Use  . Smoking status: Never Smoker  . Smokeless tobacco: Never Used  Substance and Sexual Activity  . Alcohol use: Yes    Comment: 5-8 beers/week  . Drug use: Never  . Sexual activity: Not on file  Other Topics Concern  . Not on file  Social History Narrative  . Not on file  Social Determinants of Health   Financial Resource Strain:   . Difficulty of Paying Living Expenses:   Food Insecurity:   . Worried About Programme researcher, broadcasting/film/video in the Last Year:   . Barista in the Last Year:   Transportation Needs:   . Freight forwarder (Medical):   Marland Kitchen Lack of Transportation (Non-Medical):   Physical Activity:   . Days of Exercise per Week:   . Minutes of Exercise per Session:   Stress:   . Feeling of Stress :   Social Connections:   . Frequency of Communication with Friends and Family:   . Frequency of Social Gatherings with Friends and Family:   . Attends Religious Services:   . Active Member of Clubs or Organizations:   . Attends Banker Meetings:   Marland Kitchen Marital Status:   Intimate Partner Violence:   . Fear of Current or Ex-Partner:   . Emotionally Abused:   Marland Kitchen Physically Abused:    . Sexually Abused:     Subjective: Review of Systems  Constitutional: Negative for chills and fever.  HENT: Negative for congestion and hearing loss.   Eyes: Negative for blurred vision and double vision.  Respiratory: Positive for cough. Negative for shortness of breath.   Cardiovascular: Negative for chest pain and palpitations.  Gastrointestinal: Positive for abdominal pain and heartburn. Negative for blood in stool, constipation, diarrhea, melena and vomiting.  Genitourinary: Negative for dysuria and urgency.  Musculoskeletal: Negative for joint pain and myalgias.  Skin: Negative for itching and rash.  Neurological: Negative for dizziness and headaches.  Psychiatric/Behavioral: Negative for depression. The patient is not nervous/anxious.        Objective: BP (!) 161/84   Pulse 73   Temp (!) 96.9 F (36.1 C) (Temporal)   Ht 5\' 5"  (1.651 m)   Wt 150 lb 12.8 oz (68.4 kg)   BMI 25.09 kg/m  Physical Exam Constitutional:      Appearance: Normal appearance.  HENT:     Head: Normocephalic and atraumatic.  Eyes:     Extraocular Movements: Extraocular movements intact.     Conjunctiva/sclera: Conjunctivae normal.  Cardiovascular:     Rate and Rhythm: Normal rate and regular rhythm.  Pulmonary:     Effort: Pulmonary effort is normal.     Breath sounds: Normal breath sounds.  Abdominal:     General: Bowel sounds are normal.     Palpations: Abdomen is soft.  Musculoskeletal:        General: No swelling. Normal range of motion.     Cervical back: Normal range of motion and neck supple.  Skin:    General: Skin is warm and dry.     Coloration: Skin is not jaundiced.  Neurological:     General: No focal deficit present.     Mental Status: She is alert and oriented to person, place, and time.  Psychiatric:        Mood and Affect: Mood normal.        Behavior: Behavior normal.      Assessment: *Dyspepsia *Chronic reflux *Hiatal hernia-chronic, symptoms  worsening *Colon cancer screening *Dysphagia-new, primarily with solids  Plan: -Will schedule for EGD to evaluate for peptic ulcer disease, esophagitis, gastritis, H. Pylori, duodenitis, or other. Will also evaluate for esophageal stricture, Schatzki's ring, esophageal web or other.  -Will schedule for screening colonoscopy to be performed at the same time. -Continue on omeprazole as needed for breakthrough reflux symptoms -Further recommendations to follow  The risks of procedures including infection, bleed, or perforation as well as benefits, limitations, alternatives and imponderables have been reviewed with the patient. Potential for esophageal dilation, biopsy, etc. have also been reviewed.  Questions have been answered. All parties agreeable.   01/14/2020 2:54 PM   Disclaimer: This note was dictated with voice recognition software. Similar sounding words can inadvertently be transcribed and may not be corrected upon review.

## 2020-01-14 NOTE — Patient Instructions (Addendum)
Schedule EGD and Colonoscopy today in office  Take OTC Omeprazole as needed  Further recommendations to follow  Lifestyle and home remedies TO MANAGE REFLUX/HEARTBURN    You may eliminate or reduce the frequency of heartburn by making the following lifestyle changes:    Control your weight. Being overweight is a major risk factor for heartburn and GERD. Excess pounds put pressure on your abdomen, pushing up your stomach and causing acid to back up into your esophagus.     Eat smaller meals. 4 TO 6 MEALS A DAY. This reduces pressure on the lower esophageal sphincter, helping to prevent the valve from opening and acid from washing back into your esophagus.      Loosen your belt. Clothes that fit tightly around your waist put pressure on your abdomen and the lower esophageal sphincter.      Eliminate heartburn triggers. Everyone has specific triggers. Common triggers such as fatty or fried foods, spicy food, tomato sauce, carbonated beverages, alcohol, chocolate, mint, garlic, onion, caffeine and nicotine may make heartburn worse.     Avoid stooping or bending. Tying your shoes is OK. Bending over for longer periods to weed your garden isn't, especially soon after eating.     Don't lie down after a meal. Wait at least three to four hours after eating before going to bed, and don't lie down right after eating.   At [facility] we value your feedback. You may receive a survey about your visit today. Please share your experience as we strive to create trusting relationships with our patients to provide genuine, compassionate, quality care.  We appreciate your understanding and patience as we review any laboratory studies, imaging, and other diagnostic tests that are ordered as we care for you. Our office policy is 5 business days for review of these results, and any emergent or urgent results are addressed in a timely manner for your best interest. If you do not hear from  our office in 1 week, please contact us.   We also encourage the use of MyChart, which contains your medical information for your review as well. If you are not enrolled in this feature, an access code is on this after visit summary for your convenience. Thank you for allowing [place] to be involved in your care.  It was great to see you today!  I hope you have a great rest of your week!!

## 2020-01-19 DIAGNOSIS — Z299 Encounter for prophylactic measures, unspecified: Secondary | ICD-10-CM | POA: Diagnosis not present

## 2020-01-19 DIAGNOSIS — R030 Elevated blood-pressure reading, without diagnosis of hypertension: Secondary | ICD-10-CM | POA: Diagnosis not present

## 2020-01-20 ENCOUNTER — Telehealth: Payer: Self-pay | Admitting: Internal Medicine

## 2020-01-20 NOTE — Telephone Encounter (Signed)
Called pt, TCS/EGD/DIL w/Propofol w/Dr. Marletta Lor scheduled for 02/26/20 at 11:00am. COVID test 02/25/20 at 10:00am. PCP is having her monitor BP and she may start BP medication prior to procedure. Informed her she will receive call prior to procedure to update meds and would be monitored during procedure. Orders entered. Appt letter mailed with procedure instructions.  PA for TCS/EGD/DIL submitted via [employer] website. Case approved. [employer]# [id number], valid 02/26/20-03/27/20.

## 2020-01-20 NOTE — Telephone Encounter (Signed)
Tried to call pt, no answer, LMOVM for return call.  

## 2020-01-20 NOTE — Telephone Encounter (Signed)
[phone]  PLEASE CALL PATIENT ABOUT SCHEDULING HER TCS

## 2020-02-23 ENCOUNTER — Telehealth: Payer: Self-pay | Admitting: Internal Medicine

## 2020-02-23 NOTE — Telephone Encounter (Signed)
LMOVM

## 2020-02-23 NOTE — Telephone Encounter (Signed)
Pt is scheduled procedure with Dr Marletta Lor on 02/26/2020 and has questions regarding her procedure. I went over the clear liquid diet with her, but she still asked for the scheduler to call [phone]

## 2020-02-23 NOTE — Telephone Encounter (Signed)
Patient called back and questions answered.

## 2020-02-25 ENCOUNTER — Other Ambulatory Visit: Payer: Self-pay

## 2020-02-25 ENCOUNTER — Other Ambulatory Visit (HOSPITAL_COMMUNITY)
Admission: RE | Admit: 2020-02-25 | Discharge: 2020-02-25 | Disposition: A | Discharge status: Home / Self Care | Payer: Medicare PPO | Source: Ambulatory Visit | Attending: Internal Medicine | Admitting: Internal Medicine

## 2020-02-25 DIAGNOSIS — Z20822 Contact with and (suspected) exposure to covid-19: Secondary | ICD-10-CM | POA: Insufficient documentation

## 2020-02-25 DIAGNOSIS — Z01812 Encounter for preprocedural laboratory examination: Secondary | ICD-10-CM | POA: Diagnosis not present

## 2020-02-25 LAB — SARS CORONAVIRUS 2 (TAT 6-24 HRS): SARS Coronavirus 2: NEGATIVE

## 2020-02-26 ENCOUNTER — Other Ambulatory Visit: Payer: Self-pay

## 2020-02-26 ENCOUNTER — Encounter (HOSPITAL_COMMUNITY)
Admission: RE | Disposition: A | Discharge status: Home / Self Care | Payer: Self-pay | Source: Home / Self Care | Attending: Internal Medicine

## 2020-02-26 ENCOUNTER — Encounter (HOSPITAL_COMMUNITY): Payer: Self-pay

## 2020-02-26 ENCOUNTER — Ambulatory Visit (HOSPITAL_COMMUNITY): Payer: Medicare PPO | Admitting: Anesthesiology

## 2020-02-26 ENCOUNTER — Ambulatory Visit (HOSPITAL_COMMUNITY)
Admission: RE | Admit: 2020-02-26 | Discharge: 2020-02-26 | Disposition: A | Discharge status: Home / Self Care | Payer: Medicare PPO | Attending: Internal Medicine | Admitting: Internal Medicine

## 2020-02-26 DIAGNOSIS — R12 Heartburn: Secondary | ICD-10-CM | POA: Diagnosis not present

## 2020-02-26 DIAGNOSIS — G709 Myoneural disorder, unspecified: Secondary | ICD-10-CM | POA: Diagnosis not present

## 2020-02-26 DIAGNOSIS — K2950 Unspecified chronic gastritis without bleeding: Secondary | ICD-10-CM | POA: Diagnosis not present

## 2020-02-26 DIAGNOSIS — Z886 Allergy status to analgesic agent status: Secondary | ICD-10-CM | POA: Diagnosis not present

## 2020-02-26 DIAGNOSIS — K2100 Gastro-esophageal reflux disease with esophagitis, without bleeding: Secondary | ICD-10-CM

## 2020-02-26 DIAGNOSIS — K648 Other hemorrhoids: Secondary | ICD-10-CM | POA: Diagnosis not present

## 2020-02-26 DIAGNOSIS — Z888 Allergy status to other drugs, medicaments and biological substances status: Secondary | ICD-10-CM | POA: Diagnosis not present

## 2020-02-26 DIAGNOSIS — I10 Essential (primary) hypertension: Secondary | ICD-10-CM | POA: Diagnosis not present

## 2020-02-26 DIAGNOSIS — K222 Esophageal obstruction: Secondary | ICD-10-CM

## 2020-02-26 DIAGNOSIS — Z1211 Encounter for screening for malignant neoplasm of colon: Secondary | ICD-10-CM

## 2020-02-26 DIAGNOSIS — Q438 Other specified congenital malformations of intestine: Secondary | ICD-10-CM | POA: Diagnosis not present

## 2020-02-26 DIAGNOSIS — K3189 Other diseases of stomach and duodenum: Secondary | ICD-10-CM | POA: Diagnosis not present

## 2020-02-26 DIAGNOSIS — K449 Diaphragmatic hernia without obstruction or gangrene: Secondary | ICD-10-CM | POA: Insufficient documentation

## 2020-02-26 DIAGNOSIS — R1310 Dysphagia, unspecified: Secondary | ICD-10-CM | POA: Diagnosis not present

## 2020-02-26 DIAGNOSIS — Z8249 Family history of ischemic heart disease and other diseases of the circulatory system: Secondary | ICD-10-CM | POA: Diagnosis not present

## 2020-02-26 DIAGNOSIS — K319 Disease of stomach and duodenum, unspecified: Secondary | ICD-10-CM | POA: Insufficient documentation

## 2020-02-26 DIAGNOSIS — K219 Gastro-esophageal reflux disease without esophagitis: Secondary | ICD-10-CM | POA: Insufficient documentation

## 2020-02-26 DIAGNOSIS — K2970 Gastritis, unspecified, without bleeding: Secondary | ICD-10-CM | POA: Diagnosis not present

## 2020-02-26 DIAGNOSIS — R1013 Epigastric pain: Secondary | ICD-10-CM | POA: Diagnosis not present

## 2020-02-26 HISTORY — PX: ESOPHAGOGASTRODUODENOSCOPY (EGD) WITH PROPOFOL: SHX5813

## 2020-02-26 HISTORY — PX: BIOPSY: SHX5522

## 2020-02-26 HISTORY — PX: COLONOSCOPY WITH PROPOFOL: SHX5780

## 2020-02-26 HISTORY — PX: BALLOON DILATION: SHX5330

## 2020-02-26 SURGERY — COLONOSCOPY WITH PROPOFOL
Anesthesia: General

## 2020-02-26 MED ORDER — LIDOCAINE 2% (20 MG/ML) 5 ML SYRINGE
INTRAMUSCULAR | Status: DC | PRN
  Administered 2020-02-26: 60 mg via INTRAVENOUS
  Administered 2020-02-26: 40 mg via INTRAVENOUS

## 2020-02-26 MED ORDER — LACTATED RINGERS IV SOLN
Freq: Once | INTRAVENOUS | Status: AC
  Administered 2020-02-26: 1000 mL via INTRAVENOUS

## 2020-02-26 MED ORDER — LIDOCAINE VISCOUS HCL 2 % MT SOLN
OROMUCOSAL | Status: AC
  Filled 2020-02-26: qty 15

## 2020-02-26 MED ORDER — CHLORHEXIDINE GLUCONATE CLOTH 2 % EX PADS: 6.0000 | MEDICATED_PAD | Freq: Once | CUTANEOUS | Status: DC

## 2020-02-26 MED ORDER — LIDOCAINE VISCOUS HCL 2 % MT SOLN
15.0000 mL | Freq: Once | OROMUCOSAL | Status: AC
  Administered 2020-02-26: 15 mL via OROMUCOSAL

## 2020-02-26 MED ORDER — ONDANSETRON HCL 4 MG/2ML IJ SOLN
INTRAMUSCULAR | Status: AC
  Filled 2020-02-26: qty 2

## 2020-02-26 MED ORDER — OMEPRAZOLE 40 MG PO CPDR: 40.0000 mg | DELAYED_RELEASE_CAPSULE | Freq: Every day | ORAL | 5 refills | Status: DC

## 2020-02-26 MED ORDER — PROPOFOL 10 MG/ML IV BOLUS
INTRAVENOUS | Status: DC | PRN
  Administered 2020-02-26 (×2): 10 mg via INTRAVENOUS

## 2020-02-26 MED ORDER — STERILE WATER FOR IRRIGATION IR SOLN
Status: DC | PRN
  Administered 2020-02-26: 200 mL

## 2020-02-26 MED ORDER — LACTATED RINGERS IV SOLN: INTRAVENOUS | Status: DC | PRN

## 2020-02-26 MED ORDER — LIDOCAINE 2% (20 MG/ML) 5 ML SYRINGE
INTRAMUSCULAR | Status: AC
  Filled 2020-02-26: qty 5

## 2020-02-26 MED ORDER — PROPOFOL 500 MG/50ML IV EMUL
INTRAVENOUS | Status: DC | PRN
  Administered 2020-02-26: 150 ug/kg/min via INTRAVENOUS

## 2020-02-26 NOTE — Op Note (Signed)
[facility] Patient Name: [name] Procedure Date: 02/26/2020 10:26 AM MRN: [id number] Date of Birth: [birth year] Attending MD: Elon Alas. Abbey Chatters DO CSN: [id number] Age: 69 Admit Type: Outpatient Procedure:                Colonoscopy Indications:              Screening for colorectal malignant neoplasm Providers:                Elon Alas. Abbey Chatters, DO Referring MD:              Medicines:                See the Anesthesia note for documentation of the                            administered medications Complications:            No immediate complications. Estimated Blood Loss:     Estimated blood loss: none. Procedure:                Pre-Anesthesia Assessment:                           - The anesthesia plan was to use monitored                            anesthesia care (MAC).                           After obtaining informed consent, the colonoscope                            was passed under direct vision. Throughout the                            procedure, the patient's blood pressure, pulse, and                            oxygen saturations were monitored continuously. The                            [id number]) was introduced through the anus                            and advanced to the the terminal ileum, with                            identification of the appendiceal orifice and IC                            valve. The colonoscopy was performed without                            difficulty. The patient tolerated the procedure                            well. The quality of the bowel preparation was  evaluated using the BBPS [facility] Bowel Preparation                            Scale) with scores of: Right Colon = 3, Transverse                            Colon = 3 and Left Colon = 3 (entire mucosa seen                            well with no residual staining, small fragments of                            stool or opaque liquid).  The total BBPS score                            equals 9. Scope In: 10:28:14 AM Scope Out: 10:40:24 AM Scope Withdrawal Time: 0 hours 6 minutes 37 seconds  Total Procedure Duration: 0 hours 12 minutes 10 seconds  Findings:      The perianal and digital rectal examinations were normal.      Non-bleeding internal hemorrhoids were found during endoscopy.      The terminal ileum appeared normal.      The exam was otherwise without abnormality.      The colon (entire examined portion) was moderately tortuous. Advancing       the scope required applying abdominal pressure. Impression:               - Non-bleeding internal hemorrhoids.                           - The examined portion of the ileum was normal.                           - The examination was otherwise normal.                           - Tortuous colon.                           - No specimens collected. Moderate Sedation:      Per Anesthesia Care Recommendation:           - Patient has a contact number available for                            emergencies. The signs and symptoms of potential                            delayed complications were discussed with the                            patient. Return to normal activities tomorrow.                            Written discharge instructions were provided to the  patient.                           - Resume previous diet.                           - Continue present medications.                           - Repeat colonoscopy in 10 years for screening                            purposes.                           - Return to GI clinic in 3 months. Procedure Code(s):        --- Professional ---                           T1438, Colorectal cancer screening; colonoscopy on                            individual not meeting criteria for high risk Diagnosis Code(s):        --- Professional ---                           Z12.11, Encounter for screening for  malignant                            neoplasm of colon                           K64.8, Other hemorrhoids                           Q43.8, Other specified congenital malformations of                            intestine CPT copyright 2019 American Medical Association. All rights reserved. The codes documented in this report are preliminary and upon coder review may  be revised to meet current compliance requirements. Elon Alas. Abbey Chatters, [name], DO 02/26/2020 10:45:55 AM This report has been signed electronically. Number of Addenda: 0

## 2020-02-26 NOTE — Op Note (Signed)
[facility] Patient Name: [name] Procedure Date: 02/26/2020 10:14 AM MRN: [id number] Date of Birth: 1950-10-09 Attending MD: Elon Alas. Abbey Chatters DO CSN: [id number] Age: 69 Admit Type: Outpatient Procedure:                Upper GI endoscopy Indications:              Dysphagia, Heartburn Providers:                Elon Alas. Abbey Chatters, DO, Eden Page, [place]                            Theda Sers RN, RN Referring MD:              Medicines:                See the Anesthesia note for documentation of the                            administered medications Complications:            No immediate complications. Estimated Blood Loss:     Estimated blood loss was minimal. Procedure:                Pre-Anesthesia Assessment:                           - The anesthesia plan was to use monitored                            anesthesia care (MAC).                           After obtaining informed consent, the endoscope was                            passed under direct vision. Throughout the                            procedure, the patient's blood pressure, pulse, and                            oxygen saturations were monitored continuously. The                            [id number] ([id number]) was introduced through the                            mouth, and advanced to the second part of duodenum.                            The upper GI endoscopy was accomplished without                            difficulty. The patient tolerated the procedure                            well. Scope In: 10:16:48 AM Scope  Out: 10:22:42 AM Total Procedure Duration: 0 hours 5 minutes 54 seconds  Findings:      A small hiatal hernia was present.      LA Grade A (one or more mucosal breaks less than 5 mm, not extending       between tops of 2 mucosal folds) esophagitis with no bleeding was found       at the gastroesophageal junction.      One benign-appearing, intrinsic mild stenosis was found in the lower        third of the esophagus. The stenosis was traversed. A TTS dilator was       passed through the scope. Dilation with an 18-19-20 mm balloon dilator       was performed to 20 mm. The dilation site was examined and showed       moderate improvement in luminal narrowing.      Diffuse mild inflammation characterized by erythema was found in the       entire examined stomach. Biopsies were taken with a cold forceps for       Helicobacter pylori testing.      The duodenal bulb, first portion of the duodenum and second portion of       the duodenum were normal. Biopsies for histology were taken with a cold       forceps for evaluation of celiac disease. Impression:               - Small hiatal hernia.                           - LA Grade A reflux esophagitis with no bleeding.                           - Benign-appearing esophageal stenosis. Dilated.                           - Gastritis. Biopsied.                           - Normal duodenal bulb, first portion of the                            duodenum and second portion of the duodenum.                            Biopsied. Moderate Sedation:      Per Anesthesia Care Recommendation:           - Patient has a contact number available for                            emergencies. The signs and symptoms of potential                            delayed complications were discussed with the                            patient. Return to normal activities tomorrow.  Written discharge instructions were provided to the                            patient.                           - Resume previous diet.                           - Continue present medications.                           - Await pathology results.                           - Repeat upper endoscopy PRN for retreatment.                           - Use Prilosec (omeprazole) 40 mg PO daily.                           - Return to GI clinic in 3 months. Procedure  Code(s):        --- Professional ---                           [phone], Esophagogastroduodenoscopy, flexible,                            transoral; with transendoscopic balloon dilation of                            esophagus (less than 30 mm diameter)                           43239, 59, Esophagogastroduodenoscopy, flexible,                            transoral; with biopsy, single or multiple Diagnosis Code(s):        --- Professional ---                           K44.9, Diaphragmatic hernia without obstruction or                            gangrene                           K21.00, Gastro-esophageal reflux disease with                            esophagitis, without bleeding                           K22.2, Esophageal obstruction                           K29.70, Gastritis, unspecified, without bleeding  R13.10, Dysphagia, unspecified                           R12, Heartburn CPT copyright 2019 American Medical Association. All rights reserved. The codes documented in this report are preliminary and upon coder review may  be revised to meet current compliance requirements. Elon Alas. Abbey Chatters, [place] Abbey Chatters, DO 02/26/2020 10:27:12 AM This report has been signed electronically. Number of Addenda: 0

## 2020-02-26 NOTE — Anesthesia Postprocedure Evaluation (Signed)
Anesthesia Post Note  Patient: [name]  Procedure(s) Performed: COLONOSCOPY WITH PROPOFOL (N/A ) ESOPHAGOGASTRODUODENOSCOPY (EGD) WITH PROPOFOL (N/A ) BALLOON DILATION (N/A ) BIOPSY  Patient location during evaluation: Endoscopy Anesthesia Type: MAC Level of consciousness: awake and awake and alert Pain management: pain level controlled Vital Signs Assessment: post-procedure vital signs reviewed and stable Respiratory status: spontaneous breathing Cardiovascular status: blood pressure returned to baseline Anesthetic complications: no   No complications documented.   Last Vitals:  Vitals:   02/26/20 0942 02/26/20 1046  BP: 129/86 127/74  Pulse: 85 66  Resp: 10 (!) 22  Temp: 36.5 C 36.7 C  SpO2: 98% 100%    Last Pain:  Vitals:   02/26/20 1046  TempSrc: Oral  PainSc:                  Joon Pohle

## 2020-02-26 NOTE — Transfer of Care (Signed)
Immediate Anesthesia Transfer of Care Note  Patient: [name]  Procedure(s) Performed: COLONOSCOPY WITH PROPOFOL (N/A ) ESOPHAGOGASTRODUODENOSCOPY (EGD) WITH PROPOFOL (N/A ) BALLOON DILATION (N/A ) BIOPSY  Patient Location: Endoscopy Unit  Anesthesia Type:MAC  Level of Consciousness: awake  Airway & Oxygen Therapy: Patient Spontanous Breathing and Patient connected to nasal cannula oxygen  Post-op Assessment: Report given to RN and Post -op Vital signs reviewed and stable  Post vital signs: Reviewed and stable  Last Vitals:  Vitals Value Taken Time  BP    Temp    Pulse    Resp    SpO2      Last Pain:  Vitals:   02/26/20 1011  TempSrc:   PainSc: 0-No pain      Patients Stated Pain Goal: 7 (19/16/60 6004)  Complications: No complications documented.

## 2020-02-26 NOTE — Discharge Instructions (Signed)
EGD Discharge instructions Please read the instructions outlined below and refer to this sheet in the next few weeks. These discharge instructions provide you with general information on caring for yourself after you leave the hospital. Your doctor may also give you specific instructions. While your treatment has been planned according to the most current medical practices available, unavoidable complications occasionally occur. If you have any problems or questions after discharge, please call your doctor. ACTIVITY  You may resume your regular activity but move at a slower pace for the next 24 hours.   Take frequent rest periods for the next 24 hours.   Walking will help expel (get rid of) the air and reduce the bloated feeling in your abdomen.   No driving for 24 hours (because of the anesthesia (medicine) used during the test).   You may shower.   Do not sign any important legal documents or operate any machinery for 24 hours (because of the anesthesia used during the test).  NUTRITION  Drink plenty of fluids.   You may resume your normal diet.   Begin with a light meal and progress to your normal diet.   Avoid alcoholic beverages for 24 hours or as instructed by your caregiver.  MEDICATIONS  You may resume your normal medications unless your caregiver tells you otherwise.  WHAT YOU CAN EXPECT TODAY  You may experience abdominal discomfort such as a feeling of fullness or "gas" pains.  FOLLOW-UP  Your doctor will discuss the results of your test with you.  SEEK IMMEDIATE MEDICAL ATTENTION IF ANY OF THE FOLLOWING OCCUR:  Excessive nausea (feeling sick to your stomach) and/or vomiting.   Severe abdominal pain and distention (swelling).   Trouble swallowing.   Temperature over 101 F (37.8 C).   Rectal bleeding or vomiting of blood.     Colonoscopy Discharge Instructions  Read the instructions outlined below and refer to this sheet in the next few weeks. These  discharge instructions provide you with general information on caring for yourself after you leave the hospital. Your doctor may also give you specific instructions. While your treatment has been planned according to the most current medical practices available, unavoidable complications occasionally occur.   ACTIVITY  You may resume your regular activity, but move at a slower pace for the next 24 hours.   Take frequent rest periods for the next 24 hours.   Walking will help get rid of the air and reduce the bloated feeling in your belly (abdomen).   No driving for 24 hours (because of the medicine (anesthesia) used during the test).    Do not sign any important legal documents or operate any machinery for 24 hours (because of the anesthesia used during the test).  NUTRITION  Drink plenty of fluids.   You may resume your normal diet as instructed by your doctor.   Begin with a light meal and progress to your normal diet. Heavy or fried foods are harder to digest and may make you feel sick to your stomach (nauseated).   Avoid alcoholic beverages for 24 hours or as instructed.  MEDICATIONS  You may resume your normal medications unless your doctor tells you otherwise.  WHAT YOU CAN EXPECT TODAY  Some feelings of bloating in the abdomen.   Passage of more gas than usual.   Spotting of blood in your stool or on the toilet paper.  IF YOU HAD POLYPS REMOVED DURING THE COLONOSCOPY:  No aspirin products for 7 days or as instructed.  No alcohol for 7 days or as instructed.   Eat a soft diet for the next 24 hours.  FINDING OUT THE RESULTS OF YOUR TEST Not all test results are available during your visit. If your test results are not back during the visit, make an appointment with your caregiver to find out the results. Do not assume everything is normal if you have not heard from your caregiver or the medical facility. It is important for you to follow up on all of your test results.    SEEK IMMEDIATE MEDICAL ATTENTION IF:  You have more than a spotting of blood in your stool.   Your belly is swollen (abdominal distention).   You are nauseated or vomiting.   You have a temperature over 101.   You have abdominal pain or discomfort that is severe or gets worse throughout the day.   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  Also had inflammation in your esophagus and stomach.  You have a very small hiatal hernia.  I took biopsies of both your stomach and small bowel.  We should have these results by next week.  I want to start you on omeprazole 40 mg daily for at least the next 8 weeks.  Follow-up with GI in 3 months or sooner if needed.  Your colonoscopy was unremarkable besides internal hemorrhoids.  I did not find any polyps.  I recommend repeating this in 10 years for screening purposes.  I hope you have a great weekend!  Hennie Duos. Marletta Lor, D.O. Gastroenterology and Hepatology [facility]

## 2020-02-26 NOTE — H&P (Signed)
Primary Care Physician:  Ignatius Specking, MD Primary Gastroenterologist:  Dr. Marletta Lor  Pre-Procedure History & Physical: HPI:  [name] is a 69 y.o. female is here for a colonoscopy to be performed for screening and EGD for GERD, dysphagia   Past Medical History:  Diagnosis Date   GERD (gastroesophageal reflux disease)    Hypertension     Past Surgical History:  Procedure Laterality Date   ABDOMINAL HYSTERECTOMY      Prior to Admission medications   Medication Sig Start Date End Date Taking? Authorizing Provider  Apple Cider Vinegar 500 MG TABS Take 500 mg by mouth daily.    Yes [provider]  Aromatic Inhalants (VICKS VAPOINHALER) INHA Inhale 1 puff into the lungs daily as needed (congestion).   Yes [provider]  Bioflavonoid Products (ESTER C PO) Take 500 mg by mouth daily.   Yes [provider]  Biotin 1000 MCG tablet Take 1,000 mcg by mouth daily.    Yes [provider]  Calcium Carb-Cholecalciferol (CALCIUM 600+D) 600-800 MG-UNIT TABS Take 1 tablet by mouth daily.   Yes [provider]  calcium elemental as carbonate (TUMS ULTRA 1000) 400 MG chewable tablet Chew 1,000 mg by mouth daily as needed for heartburn.   Yes [provider]  Cholecalciferol (VITAMIN D) 50 MCG (2000 UT) tablet Take 2,000 Units by mouth daily.   Yes [provider]  Menthol-Methyl Salicylate (SALONPAS PAIN RELIEF PATCH EX) Apply 1 patch topically daily as needed (pain).   Yes [provider]  Multiple Vitamins-Minerals (PRESERVISION AREDS 2) CAPS Take 1 capsule by mouth every other day.   Yes [provider]  omeprazole (PRILOSEC OTC) 20 MG tablet Take 10 mg by mouth daily as needed (acid reflux).   Yes [provider]  Prenatal Vit-Fe Fumarate-FA (PRENATAL PO) Take 1 tablet by mouth daily as needed (when not taking the other supplements).    Yes [provider]  SIMPLY SALINE NA Place 1 spray into the  nose daily as needed (dryness).   Yes [provider]  SUPER B COMPLEX/C PO Take 1 tablet by mouth daily.   Yes [provider]  vitamin E (VITAMIN E) 180 MG (400 UNITS) capsule Take 400 Units by mouth every other day.    Yes [provider]  zinc gluconate 50 MG tablet Take 50 mg by mouth daily.    Yes [provider]    Allergies as of 01/20/2020 - Review Complete 01/14/2020  Allergen Reaction Noted   Ibuprofen  03/13/2010   Nsaids  01/31/2018   Propranolol  01/31/2018    Family History  Problem Relation Age of Onset   Stomach cancer Father    Diverticulitis Sister    Heart disease Brother     Social History   Socioeconomic History   Marital status: Married    Spouse name: Not on file   Number of children: Not on file   Years of education: Not on file   Highest education level: Not on file  Occupational History   Not on file  Tobacco Use   Smoking status: Never Smoker   Smokeless tobacco: Never Used  Substance and Sexual Activity   Alcohol use: Yes    Comment: 5-8 beers/week   Drug use: Never   Sexual activity: Not on file  Other Topics Concern   Not on file  Social History Narrative   Not on file   Social Determinants of Health   Financial Resource  Strain:    Difficulty of Paying Living Expenses: Not on file  Food Insecurity:    Worried About Running Out of Food in the Last Year: Not on file   Ran Out of Food in the Last Year: Not on file  Transportation Needs:    Lack of Transportation (Medical): Not on file   Lack of Transportation (Non-Medical): Not on file  Physical Activity:    Days of Exercise per Week: Not on file   Minutes of Exercise per Session: Not on file  Stress:    Feeling of Stress : Not on file  Social Connections:    Frequency of Communication with Friends and Family: Not on file   Frequency of Social Gatherings with Friends and Family: Not on file   Attends Religious  Services: Not on file   Active Member of Clubs or Organizations: Not on file   Attends Banker Meetings: Not on file   Marital Status: Not on file  Intimate Partner Violence:    Fear of Current or Ex-Partner: Not on file   Emotionally Abused: Not on file   Physically Abused: Not on file   Sexually Abused: Not on file    Review of Systems: See HPI, otherwise negative ROS  Impression/Plan: [name] is here for a colonoscopy to be performed for screening and EGD for GERD, dysphagia   Risks, benefits, limitations, imponderables and alternatives regarding colonoscopy have been reviewed with the patient. Questions have been answered. All parties agreeable.

## 2020-02-26 NOTE — Anesthesia Preprocedure Evaluation (Addendum)
Anesthesia Evaluation  Patient identified by MRN, date of birth, ID band Patient awake    Reviewed: Allergy & Precautions, NPO status , Patient's Chart, lab work & pertinent test results  Airway Mallampati: II  TM Distance: >3 FB Neck ROM: Full    Dental  (+) Dental Advisory Given, Chipped,    Pulmonary neg pulmonary ROS,    Pulmonary exam normal breath sounds clear to auscultation       Cardiovascular Exercise Tolerance: Good hypertension (BP fluctuates, sometimes high), Normal cardiovascular exam Rhythm:Regular Rate:Normal     Neuro/Psych  Neuromuscular disease negative psych ROS   GI/Hepatic hiatal hernia, GERD  Controlled,(+)     substance abuse  alcohol use, Hepatitis -  Endo/Other  negative endocrine ROS  Renal/GU negative Renal ROS     Musculoskeletal negative musculoskeletal ROS (+)   Abdominal   Peds negative pediatric ROS (+)  Hematology negative hematology ROS (+)   Anesthesia Other Findings   Reproductive/Obstetrics negative OB ROS                            Anesthesia Physical Anesthesia Plan  ASA: II  Anesthesia Plan: General   Post-op Pain Management:    Induction: Intravenous  PONV Risk Score and Plan: TIVA  Airway Management Planned: Nasal Cannula and Natural Airway  Additional Equipment:   Intra-op Plan:   Post-operative Plan:   Informed Consent: I have reviewed the patients History and Physical, chart, labs and discussed the procedure including the risks, benefits and alternatives for the proposed anesthesia with the patient or authorized representative who has indicated his/her understanding and acceptance.     Dental advisory given  Plan Discussed with: CRNA and Surgeon  Anesthesia Plan Comments:        Anesthesia Quick Evaluation

## 2020-02-26 NOTE — Anesthesia Procedure Notes (Signed)
Procedure Name: MAC Date/Time: 02/26/2020 10:18 AM Performed by: Lieutenant Diego, CRNA Pre-anesthesia Checklist: Patient identified, Emergency Drugs available, Suction available, Patient being monitored and Timeout performed Patient Re-evaluated:Patient Re-evaluated prior to induction Oxygen Delivery Method: Nasal cannula Induction Type: IV induction

## 2020-02-29 ENCOUNTER — Other Ambulatory Visit: Payer: Self-pay

## 2020-02-29 ENCOUNTER — Telehealth: Payer: Self-pay

## 2020-02-29 LAB — SURGICAL PATHOLOGY

## 2020-02-29 NOTE — Telephone Encounter (Signed)
Lmom, waiting on a return call to discuss concerns with procedure completed on 02/26/20.

## 2020-02-29 NOTE — Telephone Encounter (Signed)
Pt returned call. Pt left a message before knowing that her husband had the postop instructions with him. Pt states that she is aware of Dr. Luvenia Starch information and thank me for calling.

## 2020-03-01 DIAGNOSIS — Z789 Other specified health status: Secondary | ICD-10-CM | POA: Diagnosis not present

## 2020-03-01 DIAGNOSIS — I10 Essential (primary) hypertension: Secondary | ICD-10-CM | POA: Diagnosis not present

## 2020-03-01 DIAGNOSIS — Z299 Encounter for prophylactic measures, unspecified: Secondary | ICD-10-CM | POA: Diagnosis not present

## 2020-03-02 ENCOUNTER — Encounter (HOSPITAL_COMMUNITY): Payer: Self-pay | Admitting: Internal Medicine

## 2020-03-23 ENCOUNTER — Telehealth: Payer: Self-pay | Admitting: Internal Medicine

## 2020-03-23 NOTE — Telephone Encounter (Signed)
Pt called to discuss her Omeprazole 40 mg daily. Pt wants to make sure Dr. Marletta Lor is aware that she had [facility] spotted fever previously,  has had some blood pressure issues and was asked to limit the amount of Prescription medication she takes. Pt said she was given Omeprazole 20 mg tabs and she was cutting it in half to take only 10 mg of Omeprazole daily. Pt wants to make sure that she isn't taking a dosage that is too high for her. Pt also said if Dr. Marletta Lor believes that 40 mg of Omeprazole is what she needs to take daily given her history of [facility] spotted fever and BP issues, she's ok with taking it.

## 2020-03-23 NOTE — Telephone Encounter (Signed)
Spoke with pt. Pt was notified of Dr. Darolyn Rua recommendations of taking Omeprazole 40 mg daily for 8 weeks and then reducing to Omeprazole 20 mg otc once daily.

## 2020-03-23 NOTE — Telephone Encounter (Signed)
Please call patient, [phone], she has concerns over a medication that Dr. Marletta Lor prescribed for her

## 2020-03-23 NOTE — Telephone Encounter (Signed)
Patient had a lot of inflammation both in her stomach and esophagus.  I would prefer that she take omeprazole 40 mg daily for at least 8 weeks total and then she can back down to 20 mg over-the-counter if her symptoms allow it.  Thank you

## 2020-03-29 DIAGNOSIS — I10 Essential (primary) hypertension: Secondary | ICD-10-CM | POA: Diagnosis not present

## 2020-03-29 DIAGNOSIS — Z299 Encounter for prophylactic measures, unspecified: Secondary | ICD-10-CM | POA: Diagnosis not present

## 2020-03-29 DIAGNOSIS — R030 Elevated blood-pressure reading, without diagnosis of hypertension: Secondary | ICD-10-CM | POA: Diagnosis not present

## 2020-03-29 DIAGNOSIS — F418 Other specified anxiety disorders: Secondary | ICD-10-CM | POA: Diagnosis not present

## 2020-04-02 DIAGNOSIS — I10 Essential (primary) hypertension: Secondary | ICD-10-CM | POA: Diagnosis not present

## 2020-04-22 DIAGNOSIS — Z299 Encounter for prophylactic measures, unspecified: Secondary | ICD-10-CM | POA: Diagnosis not present

## 2020-04-22 DIAGNOSIS — I10 Essential (primary) hypertension: Secondary | ICD-10-CM | POA: Diagnosis not present

## 2020-04-22 DIAGNOSIS — R030 Elevated blood-pressure reading, without diagnosis of hypertension: Secondary | ICD-10-CM | POA: Diagnosis not present

## 2020-05-04 ENCOUNTER — Encounter: Payer: Self-pay | Admitting: Internal Medicine

## 2020-05-04 ENCOUNTER — Telehealth: Payer: Self-pay

## 2020-05-04 ENCOUNTER — Other Ambulatory Visit: Payer: Self-pay

## 2020-05-04 ENCOUNTER — Ambulatory Visit: Payer: Medicare PPO | Admitting: Internal Medicine

## 2020-05-04 DIAGNOSIS — K2950 Unspecified chronic gastritis without bleeding: Secondary | ICD-10-CM

## 2020-05-04 DIAGNOSIS — K2970 Gastritis, unspecified, without bleeding: Secondary | ICD-10-CM | POA: Insufficient documentation

## 2020-05-04 DIAGNOSIS — K208 Other esophagitis without bleeding: Secondary | ICD-10-CM | POA: Diagnosis not present

## 2020-05-04 NOTE — Progress Notes (Signed)
Referring Provider: Ignatius Specking, MD Primary Care Physician:  Ignatius Specking, MD Primary GI:  Dr. Marletta Lor  Chief Complaint  Patient presents with  . Follow-up    TCS/EGD 02/2020    HPI:   [name] is a 69 y.o. female who presents to the clinic today for follow-up visit.  She underwent EGD 02/26/2020 for heartburn, cough, hoarseness, and dysphagia.  She had LA grade a esophagitis as well as gastritis, negative for H. pylori.  She has been on omeprazole 40 mg daily since that time and states her symptoms are vastly improved.  No longer having heartburn as long she takes her medication.  Has been able to cut out Tums altogether.  She was worried about her blood pressure which sounds like it has been difficult to control.  Colonoscopy for the same time unremarkable with a 10-year recall.  Past Medical History:  Diagnosis Date  . GERD (gastroesophageal reflux disease)   . Hypertension     Past Surgical History:  Procedure Laterality Date  . ABDOMINAL HYSTERECTOMY    . BALLOON DILATION N/A 02/26/2020   Procedure: BALLOON DILATION;  Surgeon: Lanelle Bal, DO;  [facility]: [facility];  Service: Endoscopy;  Laterality: N/A;  . BIOPSY  02/26/2020   Procedure: BIOPSY;  Surgeon: Lanelle Bal, DO;  Location: [facility];  Service: Endoscopy;;  . COLONOSCOPY WITH PROPOFOL N/A 02/26/2020   Procedure: COLONOSCOPY WITH PROPOFOL;  Surgeon: Lanelle Bal, DO;  Location: [facility];  Service: Endoscopy;  Laterality: N/A;  11:00am  . ESOPHAGOGASTRODUODENOSCOPY (EGD) WITH PROPOFOL N/A 02/26/2020   Procedure: ESOPHAGOGASTRODUODENOSCOPY (EGD) WITH PROPOFOL;  Surgeon: Lanelle Bal, DO;  Location: [facility];  Service: Endoscopy;  Laterality: N/A;    Current Outpatient Medications  Medication Sig Dispense Refill  . Apple Cider Vinegar 500 MG TABS Take 500 mg by mouth daily.     . Aromatic Inhalants (VICKS VAPOINHALER) INHA Inhale 1 puff into the lungs daily as needed  (congestion).    . Biotin 1000 MCG tablet Take 1,000 mcg by mouth daily.     . Calcium Carb-Cholecalciferol (CALCIUM 600+D) 600-800 MG-UNIT TABS Take 1 tablet by mouth daily.    . Cholecalciferol (VITAMIN D) 50 MCG (2000 UT) tablet Take 2,000 Units by mouth daily.    . Menthol-Methyl Salicylate (SALONPAS PAIN RELIEF PATCH EX) Apply 1 patch topically daily as needed (pain).    . Multiple Vitamins-Minerals (PRESERVISION AREDS 2) CAPS Take 1 capsule by mouth every other day.    Marland Kitchen omeprazole (PRILOSEC) 40 MG capsule Take 1 capsule (40 mg total) by mouth daily. 30 capsule 5  . Prenatal Vit-Fe Fumarate-FA (PRENATAL PO) Take 1 tablet by mouth daily as needed (when not taking the other supplements).     . SIMPLY SALINE NA Place 1 spray into the nose daily as needed (dryness).    . SUPER B COMPLEX/C PO Take 1 tablet by mouth daily.    . valsartan (DIOVAN) 80 MG tablet Take 80 mg by mouth daily.    . vitamin E (VITAMIN E) 180 MG (400 UNITS) capsule Take 400 Units by mouth every other day.     . zinc gluconate 50 MG tablet Take 50 mg by mouth daily.     Marland Kitchen Bioflavonoid Products (ESTER C PO) Take 500 mg by mouth daily. (Patient not taking: Reported on 05/04/2020)    . calcium elemental as carbonate (TUMS ULTRA 1000) 400 MG chewable tablet Chew 1,000 mg by  mouth daily as needed for heartburn. (Patient not taking: Reported on 05/04/2020)    . omeprazole (PRILOSEC OTC) 20 MG tablet Take 10 mg by mouth daily as needed (acid reflux). (Patient not taking: Reported on 05/04/2020)     No current facility-administered medications for this visit.    Allergies as of 05/04/2020 - Review Complete 05/04/2020  Allergen Reaction Noted  . Ibuprofen  03/13/2010  . Nsaids  01/31/2018  . Propranolol  01/31/2018    Family History  Problem Relation Age of Onset  . Stomach cancer Father   . Diverticulitis Sister   . Heart disease Brother     Social History   Socioeconomic History  . Marital status: Married    Spouse  name: Not on file  . Number of children: Not on file  . Years of education: Not on file  . Highest education level: Not on file  Occupational History  . Not on file  Tobacco Use  . Smoking status: Never Smoker  . Smokeless tobacco: Never Used  Substance and Sexual Activity  . Alcohol use: Yes    Comment: 5-8 beers/week  . Drug use: Never  . Sexual activity: Not on file  Other Topics Concern  . Not on file  Social History Narrative  . Not on file   Social Determinants of Health   Financial Resource Strain:   . Difficulty of Paying Living Expenses: Not on file  Food Insecurity:   . Worried About Programme researcher, broadcasting/film/video in the Last Year: Not on file  . Ran Out of Food in the Last Year: Not on file  Transportation Needs:   . Lack of Transportation (Medical): Not on file  . Lack of Transportation (Non-Medical): Not on file  Physical Activity:   . Days of Exercise per Week: Not on file  . Minutes of Exercise per Session: Not on file  Stress:   . Feeling of Stress : Not on file  Social Connections:   . Frequency of Communication with Friends and Family: Not on file  . Frequency of Social Gatherings with Friends and Family: Not on file  . Attends Religious Services: Not on file  . Active Member of Clubs or Organizations: Not on file  . Attends Banker Meetings: Not on file  . Marital Status: Not on file    Subjective: Review of Systems  Constitutional: Negative for chills and fever.  HENT: Negative for congestion and hearing loss.   Eyes: Negative for blurred vision and double vision.  Respiratory: Negative for cough and shortness of breath.   Cardiovascular: Negative for chest pain and palpitations.  Gastrointestinal: Negative for abdominal pain, blood in stool, constipation, diarrhea, heartburn, melena and vomiting.  Genitourinary: Negative for dysuria and urgency.  Musculoskeletal: Negative for joint pain and myalgias.  Skin: Negative for itching and rash.    Neurological: Negative for dizziness and headaches.  Psychiatric/Behavioral: Negative for depression. The patient is not nervous/anxious.      Objective: BP (!) 147/75   Pulse 67   Temp (!) 96.6 F (35.9 C) (Temporal)   Ht 5\' 5"  (1.651 m)   Wt 157 lb 9.6 oz (71.5 kg)   BMI 26.23 kg/m  Physical Exam Constitutional:      Appearance: Normal appearance.  HENT:     Head: Normocephalic and atraumatic.  Eyes:     Extraocular Movements: Extraocular movements intact.     Conjunctiva/sclera: Conjunctivae normal.  Cardiovascular:     Rate and Rhythm: Normal rate  and regular rhythm.  Pulmonary:     Effort: Pulmonary effort is normal.     Breath sounds: Normal breath sounds.  Abdominal:     General: Bowel sounds are normal.     Palpations: Abdomen is soft.  Musculoskeletal:        General: No swelling. Normal range of motion.     Cervical back: Normal range of motion and neck supple.  Skin:    General: Skin is warm and dry.     Coloration: Skin is not jaundiced.  Neurological:     General: No focal deficit present.     Mental Status: She is alert and oriented to person, place, and time.  Psychiatric:        Mood and Affect: Mood normal.        Behavior: Behavior normal.      Assessment: *LA grade a esophagitis *Gastritis  Plan: Patient symptoms vastly improved on PPI therapy.  We will continue.  We will schedule patient for EGD to evaluate both healing of her esophagitis as well as to evaluate for underlying Barrett's esophagus.  Continue on omeprazole 40 mg daily for now.  I counseled that she could wean to 20 mg daily in another month as tolerated.  Avoid NSAIDs  The risks including infection, bleed, or perforation as well as benefits, limitations, alternatives and imponderables have been reviewed with the patient. Potential for esophageal dilation, biopsy, etc. have also been reviewed.  Questions have been answered. All parties agreeable.   05/04/2020 1:32  PM   Disclaimer: This note was dictated with voice recognition software. Similar sounding words can inadvertently be transcribed and may not be corrected upon review.

## 2020-05-04 NOTE — Telephone Encounter (Signed)
PA for EGD submitted via [employer] website. [employer]# [id number], valid 07/29/20-08/28/20.

## 2020-05-04 NOTE — Patient Instructions (Signed)
You on omeprazole 40 mg daily until January 1 at which point you can decrease to 20 mg daily.  We will plan on repeat EGD in February to evaluate healing of your esophagus.  Lifestyle and home remedies TO MANAGE REFLUX/HEARTBURN    You may eliminate or reduce the frequency of heartburn by making the following lifestyle changes:    Control your weight. Being overweight is a major risk factor for heartburn and GERD. Excess pounds put pressure on your abdomen, pushing up your stomach and causing acid to back up into your esophagus.     Eat smaller meals. 4 TO 6 MEALS A DAY. This reduces pressure on the lower esophageal sphincter, helping to prevent the valve from opening and acid from washing back into your esophagus.      Loosen your belt. Clothes that fit tightly around your waist put pressure on your abdomen and the lower esophageal sphincter.      Eliminate heartburn triggers. Everyone has specific triggers. Common triggers such as fatty or fried foods, spicy food, tomato sauce, carbonated beverages, alcohol, chocolate, mint, garlic, onion, caffeine and nicotine may make heartburn worse.     Avoid stooping or bending. Tying your shoes is OK. Bending over for longer periods to weed your garden isn't, especially soon after eating.     Don't lie down after a meal. Wait at least three to four hours after eating before going to bed, and don't lie down right after eating.   At [facility] we value your feedback. You may receive a survey about your visit today. Please share your experience as we strive to create trusting relationships with our patients to provide genuine, compassionate, quality care.  We appreciate your understanding and patience as we review any laboratory studies, imaging, and other diagnostic tests that are ordered as we care for you. Our office policy is 5 business days for review of these results, and any emergent or urgent results are addressed in a  timely manner for your best interest. If you do not hear from our office in 1 week, please contact us.   We also encourage the use of MyChart, which contains your medical information for your review as well. If you are not enrolled in this feature, an access code is on this after visit summary for your convenience. Thank you for allowing [place] to be involved in your care.  It was great to see you today!  I hope you have a great rest of your winter!!    Hennie Duos. Marletta Lor, D.O. Gastroenterology and Hepatology [facility]

## 2020-05-16 DIAGNOSIS — E7800 Pure hypercholesterolemia, unspecified: Secondary | ICD-10-CM | POA: Diagnosis not present

## 2020-05-16 DIAGNOSIS — Z299 Encounter for prophylactic measures, unspecified: Secondary | ICD-10-CM | POA: Diagnosis not present

## 2020-05-16 DIAGNOSIS — I10 Essential (primary) hypertension: Secondary | ICD-10-CM | POA: Diagnosis not present

## 2020-05-30 DIAGNOSIS — Z299 Encounter for prophylactic measures, unspecified: Secondary | ICD-10-CM | POA: Diagnosis not present

## 2020-05-30 DIAGNOSIS — I10 Essential (primary) hypertension: Secondary | ICD-10-CM | POA: Diagnosis not present

## 2020-07-18 DIAGNOSIS — H2512 Age-related nuclear cataract, left eye: Secondary | ICD-10-CM | POA: Diagnosis not present

## 2020-07-18 DIAGNOSIS — H26491 Other secondary cataract, right eye: Secondary | ICD-10-CM | POA: Diagnosis not present

## 2020-07-19 DIAGNOSIS — Z1331 Encounter for screening for depression: Secondary | ICD-10-CM | POA: Diagnosis not present

## 2020-07-19 DIAGNOSIS — Z0000 Encounter for general adult medical examination without abnormal findings: Secondary | ICD-10-CM | POA: Diagnosis not present

## 2020-07-19 DIAGNOSIS — Z7189 Other specified counseling: Secondary | ICD-10-CM | POA: Diagnosis not present

## 2020-07-19 DIAGNOSIS — Z79899 Other long term (current) drug therapy: Secondary | ICD-10-CM | POA: Diagnosis not present

## 2020-07-19 DIAGNOSIS — E7800 Pure hypercholesterolemia, unspecified: Secondary | ICD-10-CM | POA: Diagnosis not present

## 2020-07-19 DIAGNOSIS — Z299 Encounter for prophylactic measures, unspecified: Secondary | ICD-10-CM | POA: Diagnosis not present

## 2020-07-19 DIAGNOSIS — Z6827 Body mass index (BMI) 27.0-27.9, adult: Secondary | ICD-10-CM | POA: Diagnosis not present

## 2020-07-19 DIAGNOSIS — I10 Essential (primary) hypertension: Secondary | ICD-10-CM | POA: Diagnosis not present

## 2020-07-19 DIAGNOSIS — R5383 Other fatigue: Secondary | ICD-10-CM | POA: Diagnosis not present

## 2020-07-19 DIAGNOSIS — Z1339 Encounter for screening examination for other mental health and behavioral disorders: Secondary | ICD-10-CM | POA: Diagnosis not present

## 2020-07-25 ENCOUNTER — Encounter (HOSPITAL_COMMUNITY): Payer: Self-pay | Admitting: *Deleted

## 2020-07-25 ENCOUNTER — Telehealth: Payer: Self-pay

## 2020-07-25 NOTE — Telephone Encounter (Signed)
Pt called office and LMOVM, she wants to know arrival time for EGD 07/29/20.  Tried to call pt, LMOVM and informed her to arrive at 8:30am for EGD.

## 2020-07-27 ENCOUNTER — Other Ambulatory Visit (HOSPITAL_COMMUNITY)
Admission: RE | Admit: 2020-07-27 | Discharge: 2020-07-27 | Disposition: A | Discharge status: Home / Self Care | Payer: Medicare PPO | Source: Ambulatory Visit | Attending: Internal Medicine | Admitting: Internal Medicine

## 2020-07-27 ENCOUNTER — Other Ambulatory Visit: Payer: Self-pay

## 2020-07-27 DIAGNOSIS — Z20822 Contact with and (suspected) exposure to covid-19: Secondary | ICD-10-CM | POA: Insufficient documentation

## 2020-07-27 DIAGNOSIS — Z01812 Encounter for preprocedural laboratory examination: Secondary | ICD-10-CM | POA: Diagnosis not present

## 2020-07-28 LAB — SARS CORONAVIRUS 2 (TAT 6-24 HRS): SARS Coronavirus 2: NEGATIVE

## 2020-07-29 ENCOUNTER — Ambulatory Visit (HOSPITAL_COMMUNITY): Payer: Medicare PPO | Admitting: Anesthesiology

## 2020-07-29 ENCOUNTER — Encounter (HOSPITAL_COMMUNITY): Payer: Self-pay

## 2020-07-29 ENCOUNTER — Encounter (HOSPITAL_COMMUNITY)
Admission: RE | Disposition: A | Discharge status: Home / Self Care | Payer: Self-pay | Source: Home / Self Care | Attending: Internal Medicine

## 2020-07-29 ENCOUNTER — Other Ambulatory Visit: Payer: Self-pay

## 2020-07-29 ENCOUNTER — Ambulatory Visit (HOSPITAL_COMMUNITY)
Admission: RE | Admit: 2020-07-29 | Discharge: 2020-07-29 | Disposition: A | Discharge status: Home / Self Care | Payer: Medicare PPO | Attending: Internal Medicine | Admitting: Internal Medicine

## 2020-07-29 DIAGNOSIS — K2289 Other specified disease of esophagus: Secondary | ICD-10-CM | POA: Insufficient documentation

## 2020-07-29 DIAGNOSIS — Z79899 Other long term (current) drug therapy: Secondary | ICD-10-CM | POA: Diagnosis not present

## 2020-07-29 DIAGNOSIS — Z13810 Encounter for screening for upper gastrointestinal disorder: Secondary | ICD-10-CM | POA: Diagnosis not present

## 2020-07-29 DIAGNOSIS — Z886 Allergy status to analgesic agent status: Secondary | ICD-10-CM | POA: Diagnosis not present

## 2020-07-29 DIAGNOSIS — K209 Esophagitis, unspecified without bleeding: Secondary | ICD-10-CM | POA: Diagnosis not present

## 2020-07-29 DIAGNOSIS — K2970 Gastritis, unspecified, without bleeding: Secondary | ICD-10-CM | POA: Insufficient documentation

## 2020-07-29 DIAGNOSIS — K730 Chronic persistent hepatitis, not elsewhere classified: Secondary | ICD-10-CM | POA: Diagnosis not present

## 2020-07-29 DIAGNOSIS — Z09 Encounter for follow-up examination after completed treatment for conditions other than malignant neoplasm: Secondary | ICD-10-CM | POA: Insufficient documentation

## 2020-07-29 HISTORY — DX: Other deformities of toe(s) (acquired), right foot: M20.5X1

## 2020-07-29 HISTORY — PX: BIOPSY: SHX5522

## 2020-07-29 HISTORY — PX: ESOPHAGOGASTRODUODENOSCOPY (EGD) WITH PROPOFOL: SHX5813

## 2020-07-29 SURGERY — ESOPHAGOGASTRODUODENOSCOPY (EGD) WITH PROPOFOL
Anesthesia: General

## 2020-07-29 MED ORDER — STERILE WATER FOR IRRIGATION IR SOLN
Status: DC | PRN
  Administered 2020-07-29: 100 mL

## 2020-07-29 MED ORDER — LIDOCAINE HCL (CARDIAC) PF 100 MG/5ML IV SOSY
PREFILLED_SYRINGE | INTRAVENOUS | Status: DC | PRN
  Administered 2020-07-29: 50 mg via INTRAVENOUS

## 2020-07-29 MED ORDER — PROPOFOL 10 MG/ML IV BOLUS
INTRAVENOUS | Status: DC | PRN
  Administered 2020-07-29: 30 mg via INTRAVENOUS
  Administered 2020-07-29: 100 mg via INTRAVENOUS

## 2020-07-29 MED ORDER — LACTATED RINGERS IV SOLN: INTRAVENOUS | Status: DC

## 2020-07-29 NOTE — Discharge Instructions (Addendum)
EGD Discharge instructions Please read the instructions outlined below and refer to this sheet in the next few weeks. These discharge instructions provide you with general information on caring for yourself after you leave the hospital. Your doctor may also give you specific instructions. While your treatment has been planned according to the most current medical practices available, unavoidable complications occasionally occur. If you have any problems or questions after discharge, please call your doctor. ACTIVITY  You may resume your regular activity but move at a slower pace for the next 24 hours.   Take frequent rest periods for the next 24 hours.   Walking will help expel (get rid of) the air and reduce the bloated feeling in your abdomen.   No driving for 24 hours (because of the anesthesia (medicine) used during the test).   You may shower.   Do not sign any important legal documents or operate any machinery for 24 hours (because of the anesthesia used during the test).  NUTRITION  Drink plenty of fluids.   You may resume your normal diet.   Begin with a light meal and progress to your normal diet.   Avoid alcoholic beverages for 24 hours or as instructed by your caregiver.  MEDICATIONS  You may resume your normal medications unless your caregiver tells you otherwise.  WHAT YOU CAN EXPECT TODAY  You may experience abdominal discomfort such as a feeling of fullness or gas pains.  FOLLOW-UP  Your doctor will discuss the results of your test with you.  SEEK IMMEDIATE MEDICAL ATTENTION IF ANY OF THE FOLLOWING OCCUR:  Excessive nausea (feeling sick to your stomach) and/or vomiting.   Severe abdominal pain and distention (swelling).   Trouble swallowing.   Temperature over 101 F (37.8 C).   Rectal bleeding or vomiting of blood.   Your esophagitis has completely healed on PPI therapy.  Continue on omeprazole 20 mg daily.  If esophageal biopsies are positive for  Barrett's we will need to continue on this.  If the biopsies are negative we can consider weaning you off.  Follow-up to be determined after pathology results reviewed.  I hope you have a great rest of your week!  Hennie Duos. Marletta Lor, D.O. Gastroenterology and Hepatology [facility]

## 2020-07-29 NOTE — Transfer of Care (Signed)
Immediate Anesthesia Transfer of Care Note  Patient: [name]  Procedure(s) Performed: ESOPHAGOGASTRODUODENOSCOPY (EGD) WITH PROPOFOL (N/A ) BIOPSY  Patient Location: PACU  Anesthesia Type:General  Level of Consciousness: awake, alert  and oriented  Airway & Oxygen Therapy: Patient Spontanous Breathing  Post-op Assessment: Report given to RN and Post -op Vital signs reviewed and stable  Post vital signs: Reviewed and stable  Last Vitals:  Vitals Value Taken Time  BP    Temp    Pulse    Resp    SpO2      Last Pain:  Vitals:   07/29/20 1013  TempSrc:   PainSc: 0-No pain      Patients Stated Pain Goal: 8 (07/29/20 0846)  Complications: No complications documented.

## 2020-07-29 NOTE — Op Note (Signed)
[facility] Patient Name: [name] Attending MD: Elon Alas. Abbey Chatters DO CSN: [id number] Age: 70 Admit Type: Outpatient Procedure:                Upper GI endoscopy Indications:              Screening for Barrett's esophagus in patient at                            risk for this condition, Follow-up of esophagitis Providers:                Elon Alas. Abbey Chatters, DO, Lambert Mody, Dereck Leep, Technician Referring MD:              Medicines:                See the Anesthesia note for documentation of the                            administered medications Complications:            No immediate complications. Estimated Blood Loss:     Estimated blood loss was minimal. Procedure:                Pre-Anesthesia Assessment:                           - The anesthesia plan was to use monitored                            anesthesia care (MAC).                           After obtaining informed consent, the endoscope was                            passed under direct vision. Throughout the                            procedure, the patient's blood pressure, pulse, and                            oxygen saturations were monitored continuously. The                            [id number]-H190 ([id number]) scope was introduced through the                            mouth, and advanced to the second part of duodenum.                            The upper GI endoscopy was accomplished without  difficulty. The patient tolerated the procedure                            well. Scope In: 10:17:07 AM Scope Out: 10:21:47 AM Total Procedure Duration: 0 hours 4 minutes 40 seconds  Findings:      There is no endoscopic evidence of esophagitis in the entire esophagus.      The Z-line was irregular. Biopsies were taken with a cold forceps for       histology.       Localized mild inflammation characterized by erythema was found in the       gastric antrum.      The duodenal bulb, first portion of the duodenum and second portion of       the duodenum were normal. Impression:               - Z-line irregular. Biopsied.                           - Gastritis.                           - Normal duodenal bulb, first portion of the                            duodenum and second portion of the duodenum. Moderate Sedation:      Per Anesthesia Care Recommendation:           - Patient has a contact number available for                            emergencies. The signs and symptoms of potential                            delayed complications were discussed with the                            patient. Return to normal activities tomorrow.                            Written discharge instructions were provided to the                            patient.                           - Resume previous diet.                           - Continue present medications.                           - Await pathology results.                           - Esophagitis has completely healed on PPI therapy.  Continue on omeprazole 20 mg daily. If biopsies are                            positive for Barrett's we will need to continue on                            this. If the biopsies are negative we can consider                            weaning you off. Follow-up to be determined after                            pathology results reviewed. Procedure Code(s):        --- Professional ---                           [phone], Esophagogastroduodenoscopy, flexible,                            transoral; with biopsy, single or multiple Diagnosis Code(s):        --- Professional ---                           K22.8, Other specified diseases of esophagus                           K29.70, Gastritis, unspecified, without bleeding                           Z13.810,  Encounter for screening for upper                            gastrointestinal disorder                           K20.90, Esophagitis, unspecified without bleeding CPT copyright 2019 American Medical Association. All rights reserved. The codes documented in this report are preliminary and upon coder review may  be revised to meet current compliance requirements. Elon Alas. Abbey Chatters, DO [place] Abbey Chatters, DO 07/29/2020 10:25:16 AM This report has been signed electronically. Number of Addenda: 0

## 2020-07-29 NOTE — H&P (Signed)
Primary Care Physician:  Ignatius Specking, MD Primary Gastroenterologist:  Dr. Marletta Lor  Pre-Procedure History & Physical: HPI:  [name] is a 70 y.o. female is here for an EGD to evaluate healing of esophagitis and and screen for underlying Barrett's esophagus. She underwent EGD 02/26/2020 for heartburn, cough, hoarseness, and dysphagia.  She had LA grade a esophagitis as well as gastritis, negative for H. pylori.  She has been on omeprazole 40 mg daily since that time and states her symptoms are vastly improved.  No longer having heartburn as long she takes her medication.  Has been able to cut out Tums altogether.   Past Medical History:  Diagnosis Date  . Clubbed toes, right   . GERD (gastroesophageal reflux disease)   . Hypertension     Past Surgical History:  Procedure Laterality Date  . ABDOMINAL HYSTERECTOMY    . BALLOON DILATION N/A 02/26/2020   Procedure: BALLOON DILATION;  Surgeon: Lanelle Bal, DO;  [facility];  Service: Endoscopy;  Laterality: N/A;  . BIOPSY  02/26/2020   Procedure: BIOPSY;  Surgeon: Lanelle Bal, DO;  Location: [facility];  Service: Endoscopy;;  . COLONOSCOPY WITH PROPOFOL N/A 02/26/2020   Procedure: COLONOSCOPY WITH PROPOFOL;  Surgeon: Lanelle Bal, DO;  Location: [facility];  Service: Endoscopy;  Laterality: N/A;  11:00am  . ESOPHAGOGASTRODUODENOSCOPY (EGD) WITH PROPOFOL N/A 02/26/2020   Procedure: ESOPHAGOGASTRODUODENOSCOPY (EGD) WITH PROPOFOL;  Surgeon: Lanelle Bal, DO;  Location: [facility];  Service: Endoscopy;  Laterality: N/A;  . FOOT SURGERY Right   . NOSE SURGERY      Prior to Admission medications   Medication Sig Start Date End Date Taking? Authorizing Provider  Apple Cider Vinegar 500 MG TABS Take 500 mg by mouth daily.    Yes [provider]  Bioflavonoid Products (ESTER C PO) Take 500 mg by mouth daily.   Yes [provider]  Biotin 1000 MCG tablet Take 1,000 mcg by mouth daily.    Yes  [provider]  Calcium Carb-Cholecalciferol (CALCIUM 600+D) 600-800 MG-UNIT TABS Take 1 tablet by mouth daily.   Yes [provider]  Cholecalciferol (VITAMIN D) 50 MCG (2000 UT) tablet Take 2,000 Units by mouth daily. Nature made   Yes [provider]  Multiple Vitamins-Minerals (PRESERVISION AREDS 2) CAPS Take 1 capsule by mouth every other day.   Yes [provider]  omeprazole (PRILOSEC) 40 MG capsule Take 1 capsule (40 mg total) by mouth daily. Patient taking differently: Take 20 mg by mouth daily. 02/26/20 08/24/20 Yes Carver, Hennie Duos, DO  Prenatal Vit-Fe Fumarate-FA (PRENATAL PO) Take 1 tablet by mouth daily as needed (when not taking the other supplements).    Yes [provider]  SUPER B COMPLEX/C PO Take 1 tablet by mouth daily.   Yes [provider]  valsartan (DIOVAN) 160 MG tablet Take 160 mg by mouth daily.   Yes [provider]  vitamin E 180 MG (400 UNITS) capsule Take 400 Units by mouth daily.   Yes [provider]  zinc gluconate 50 MG tablet Take 50 mg by mouth daily.    Yes [provider]    Allergies as of 05/04/2020 - Review Complete 05/04/2020  Allergen Reaction Noted  . Ibuprofen  03/13/2010  . Nsaids  01/31/2018  . Propranolol  01/31/2018    Family History  Problem Relation Age of Onset  . Stomach cancer Father   . Diverticulitis Sister   .  Heart disease Brother     Social History   Socioeconomic History  . Marital status: Married    Spouse name: Not on file  . Number of children: Not on file  . Years of education: Not on file  . Highest education level: Not on file  Occupational History  . Not on file  Tobacco Use  . Smoking status: Never Smoker  . Smokeless tobacco: Never Used  Vaping Use  . Vaping Use: Never used  Substance and Sexual Activity  . Alcohol use: Yes    Comment: 5-8 beers/week  . Drug use: Never  . Sexual activity: Not on file  Other Topics  Concern  . Not on file  Social History Narrative  . Not on file   Social Determinants of Health   Financial Resource Strain: Not on file  Food Insecurity: Not on file  Transportation Needs: Not on file  Physical Activity: Not on file  Stress: Not on file  Social Connections: Not on file  Intimate Partner Violence: Not on file    Review of Systems: See HPI, otherwise negative ROS  Physical Exam: Vital signs in last 24 hours: Temp:  [98 F (36.7 C)] 98 F (36.7 C) (02/25 0852) Pulse Rate:  [76] 76 (02/25 0852) Resp:  [15] 15 (02/25 0852) BP: (168)/(75) 168/75 (02/25 0852) SpO2:  [98 %] 98 % (02/25 0852) Weight:  [72.1 kg] 72.1 kg (02/25 0846)   General:   Alert,  Well-developed, well-nourished, pleasant and cooperative in NAD Head:  Normocephalic and atraumatic. Eyes:  Sclera clear, no icterus.   Conjunctiva pink. Ears:  Normal auditory acuity. Nose:  No deformity, discharge,  or lesions. Mouth:  No deformity or lesions, dentition normal. Neck:  Supple; no masses or thyromegaly. Lungs:  Clear throughout to auscultation.   No wheezes, crackles, or rhonchi. No acute distress. Heart:  Regular rate and rhythm; no murmurs, clicks, rubs,  or gallops. Abdomen:  Soft, nontender and nondistended. No masses, hepatosplenomegaly or hernias noted. Normal bowel sounds, without guarding, and without rebound.   Msk:  Symmetrical without gross deformities. Normal posture. Pulses:  Normal pulses noted. Extremities:  Without clubbing or edema. Neurologic:  Alert and  oriented x4;  grossly normal neurologically. Skin:  Intact without significant lesions or rashes. Cervical Nodes:  No significant cervical adenopathy. Psych:  Alert and cooperative. Normal mood and affect.  Impression/Plan: Bretta Fees is here for an EGD to evaluate healing of esophagitis and and screen for underlying Barrett's esophagus,  The risks of the procedure including infection, bleed, or perforation as well as  benefits, limitations, alternatives and imponderables have been reviewed with the patient. Questions have been answered. All parties agreeable.

## 2020-07-29 NOTE — Anesthesia Postprocedure Evaluation (Signed)
Anesthesia Post Note  Patient: [name]  Procedure(s) Performed: ESOPHAGOGASTRODUODENOSCOPY (EGD) WITH PROPOFOL (N/A ) BIOPSY  Patient location during evaluation: Phase II Anesthesia Type: General Level of consciousness: awake and alert and oriented Pain management: satisfactory to patient Vital Signs Assessment: post-procedure vital signs reviewed and stable Respiratory status: spontaneous breathing and respiratory function stable Cardiovascular status: blood pressure returned to baseline and stable Postop Assessment: no apparent nausea or vomiting Anesthetic complications: no   No complications documented.   Last Vitals:  Vitals:   07/29/20 0852 07/29/20 1024  BP: (!) 168/75 (!) 145/74  Pulse: 76   Resp: 15 16  Temp: 36.7 C (!) 36.3 C  SpO2: 98% 97%    Last Pain:  Vitals:   07/29/20 1024  TempSrc: Oral  PainSc: 0-No pain                 Lorin Glass

## 2020-07-29 NOTE — Anesthesia Preprocedure Evaluation (Signed)
Anesthesia Evaluation  Patient identified by MRN, date of birth, ID band Patient awake    Reviewed: Allergy & Precautions, H&P , NPO status , Patient's Chart, lab work & pertinent test results  Airway Mallampati: II  TM Distance: >3 FB Neck ROM: Full    Dental  (+) Dental Advisory Given, Chipped,    Pulmonary neg pulmonary ROS,    Pulmonary exam normal breath sounds clear to auscultation       Cardiovascular Exercise Tolerance: Good hypertension, negative cardio ROS Normal cardiovascular exam Rhythm:Regular Rate:Normal     Neuro/Psych negative neurological ROS  negative psych ROS   GI/Hepatic hiatal hernia, GERD  Controlled,(+)     substance abuse  alcohol use, Hepatitis -  Endo/Other  negative endocrine ROS  Renal/GU negative Renal ROS  negative genitourinary   Musculoskeletal negative musculoskeletal ROS (+)   Abdominal   Peds negative pediatric ROS (+)  Hematology negative hematology ROS (+)   Anesthesia Other Findings   Reproductive/Obstetrics negative OB ROS                             Anesthesia Physical  Anesthesia Plan  ASA: II  Anesthesia Plan: General   Post-op Pain Management:    Induction: Intravenous  PONV Risk Score and Plan: TIVA  Airway Management Planned: Nasal Cannula and Natural Airway  Additional Equipment:   Intra-op Plan:   Post-operative Plan:   Informed Consent: I have reviewed the patients History and Physical, chart, labs and discussed the procedure including the risks, benefits and alternatives for the proposed anesthesia with the patient or authorized representative who has indicated his/her understanding and acceptance.     Dental advisory given  Plan Discussed with: CRNA and Surgeon  Anesthesia Plan Comments:         Anesthesia Quick Evaluation

## 2020-08-01 LAB — SURGICAL PATHOLOGY

## 2020-08-03 ENCOUNTER — Encounter (HOSPITAL_COMMUNITY): Payer: Self-pay | Admitting: Internal Medicine

## 2020-08-11 DIAGNOSIS — Z299 Encounter for prophylactic measures, unspecified: Secondary | ICD-10-CM | POA: Diagnosis not present

## 2020-08-11 DIAGNOSIS — I10 Essential (primary) hypertension: Secondary | ICD-10-CM | POA: Diagnosis not present

## 2020-08-11 DIAGNOSIS — K219 Gastro-esophageal reflux disease without esophagitis: Secondary | ICD-10-CM | POA: Diagnosis not present

## 2020-08-11 DIAGNOSIS — M19049 Primary osteoarthritis, unspecified hand: Secondary | ICD-10-CM | POA: Diagnosis not present

## 2020-08-19 DIAGNOSIS — M859 Disorder of bone density and structure, unspecified: Secondary | ICD-10-CM | POA: Diagnosis not present

## 2020-08-19 DIAGNOSIS — E2839 Other primary ovarian failure: Secondary | ICD-10-CM | POA: Diagnosis not present

## 2020-08-19 DIAGNOSIS — Z79899 Other long term (current) drug therapy: Secondary | ICD-10-CM | POA: Diagnosis not present

## 2020-08-26 ENCOUNTER — Telehealth: Payer: Self-pay | Admitting: Internal Medicine

## 2020-08-26 NOTE — Telephone Encounter (Signed)
Patient has multiple questions about her medications. Please call [phone]

## 2020-09-02 NOTE — Telephone Encounter (Signed)
Phoned and LM on vm for the pt to return call 

## 2020-09-02 NOTE — Telephone Encounter (Signed)
Noted   This pt as read, looks like she will not wait for your response regarding her medications. Therefore Mindy and I both are routing to you to contact this pt and to resolve.

## 2020-09-02 NOTE — Telephone Encounter (Signed)
Patient returned call. She wants to know if she can reduce her omeprazole 20mg . I advised per recent results Dr. states she needs to continue this due to mild amount of chronic inflammation. As such, I would recommend that she continue to take omeprazole 20 mg daily for now.  Patient states she is not a "normal person" and was diagnosed with "rocky mountain spotted fever" years ago and was told this could have some affect to her symptoms and should not be on medications for long periods of time. She also thinks the inflammation could be coming from allergies. She wants to decrease her omeprazole if she can. Please advise Dr. Marletta Lor thanks

## 2020-09-05 NOTE — Telephone Encounter (Signed)
Patient can attempt to wean off Omeprazole if her symptoms tolerate. Thank you

## 2020-09-05 NOTE — Telephone Encounter (Signed)
Phoned the pt and advised of the pt weaning herself off the Omperazole

## 2020-09-15 DIAGNOSIS — Z6825 Body mass index (BMI) 25.0-25.9, adult: Secondary | ICD-10-CM | POA: Diagnosis not present

## 2020-09-15 DIAGNOSIS — Z1231 Encounter for screening mammogram for malignant neoplasm of breast: Secondary | ICD-10-CM | POA: Diagnosis not present

## 2020-09-15 DIAGNOSIS — Z7689 Persons encountering health services in other specified circumstances: Secondary | ICD-10-CM | POA: Diagnosis not present

## 2020-09-15 DIAGNOSIS — Z01419 Encounter for gynecological examination (general) (routine) without abnormal findings: Secondary | ICD-10-CM | POA: Diagnosis not present

## 2020-11-16 DIAGNOSIS — Z299 Encounter for prophylactic measures, unspecified: Secondary | ICD-10-CM | POA: Diagnosis not present

## 2020-11-16 DIAGNOSIS — I10 Essential (primary) hypertension: Secondary | ICD-10-CM | POA: Diagnosis not present

## 2020-11-16 DIAGNOSIS — Z789 Other specified health status: Secondary | ICD-10-CM | POA: Diagnosis not present

## 2020-11-16 DIAGNOSIS — M2550 Pain in unspecified joint: Secondary | ICD-10-CM | POA: Diagnosis not present

## 2021-02-17 DIAGNOSIS — Z23 Encounter for immunization: Secondary | ICD-10-CM | POA: Diagnosis not present

## 2021-02-17 DIAGNOSIS — I10 Essential (primary) hypertension: Secondary | ICD-10-CM | POA: Diagnosis not present

## 2021-02-17 DIAGNOSIS — Z299 Encounter for prophylactic measures, unspecified: Secondary | ICD-10-CM | POA: Diagnosis not present

## 2021-02-17 DIAGNOSIS — F40240 Claustrophobia: Secondary | ICD-10-CM | POA: Diagnosis not present

## 2021-02-17 DIAGNOSIS — F419 Anxiety disorder, unspecified: Secondary | ICD-10-CM | POA: Diagnosis not present

## 2021-06-26 DIAGNOSIS — R195 Other fecal abnormalities: Secondary | ICD-10-CM | POA: Diagnosis not present

## 2021-06-26 DIAGNOSIS — I10 Essential (primary) hypertension: Secondary | ICD-10-CM | POA: Diagnosis not present

## 2021-06-26 DIAGNOSIS — Z6827 Body mass index (BMI) 27.0-27.9, adult: Secondary | ICD-10-CM | POA: Diagnosis not present

## 2021-06-26 DIAGNOSIS — R490 Dysphonia: Secondary | ICD-10-CM | POA: Diagnosis not present

## 2021-06-26 DIAGNOSIS — Z299 Encounter for prophylactic measures, unspecified: Secondary | ICD-10-CM | POA: Diagnosis not present

## 2021-07-13 DIAGNOSIS — Z299 Encounter for prophylactic measures, unspecified: Secondary | ICD-10-CM | POA: Diagnosis not present

## 2021-07-13 DIAGNOSIS — Z6827 Body mass index (BMI) 27.0-27.9, adult: Secondary | ICD-10-CM | POA: Diagnosis not present

## 2021-07-13 DIAGNOSIS — I499 Cardiac arrhythmia, unspecified: Secondary | ICD-10-CM | POA: Diagnosis not present

## 2021-07-13 DIAGNOSIS — R5383 Other fatigue: Secondary | ICD-10-CM | POA: Diagnosis not present

## 2021-07-13 DIAGNOSIS — Z789 Other specified health status: Secondary | ICD-10-CM | POA: Diagnosis not present

## 2021-07-24 DIAGNOSIS — R9431 Abnormal electrocardiogram [ECG] [EKG]: Secondary | ICD-10-CM | POA: Diagnosis not present

## 2021-07-25 DIAGNOSIS — H26491 Other secondary cataract, right eye: Secondary | ICD-10-CM | POA: Diagnosis not present

## 2021-07-26 DIAGNOSIS — Z6827 Body mass index (BMI) 27.0-27.9, adult: Secondary | ICD-10-CM | POA: Diagnosis not present

## 2021-07-26 DIAGNOSIS — Z7189 Other specified counseling: Secondary | ICD-10-CM | POA: Diagnosis not present

## 2021-07-26 DIAGNOSIS — Z0000 Encounter for general adult medical examination without abnormal findings: Secondary | ICD-10-CM | POA: Diagnosis not present

## 2021-07-26 DIAGNOSIS — Z299 Encounter for prophylactic measures, unspecified: Secondary | ICD-10-CM | POA: Diagnosis not present

## 2021-07-26 DIAGNOSIS — I10 Essential (primary) hypertension: Secondary | ICD-10-CM | POA: Diagnosis not present

## 2021-07-26 DIAGNOSIS — Z789 Other specified health status: Secondary | ICD-10-CM | POA: Diagnosis not present

## 2021-07-26 DIAGNOSIS — Z1331 Encounter for screening for depression: Secondary | ICD-10-CM | POA: Diagnosis not present

## 2021-07-26 DIAGNOSIS — Z1339 Encounter for screening examination for other mental health and behavioral disorders: Secondary | ICD-10-CM | POA: Diagnosis not present

## 2021-07-27 DIAGNOSIS — Z79899 Other long term (current) drug therapy: Secondary | ICD-10-CM | POA: Diagnosis not present

## 2021-07-27 DIAGNOSIS — E7800 Pure hypercholesterolemia, unspecified: Secondary | ICD-10-CM | POA: Diagnosis not present

## 2021-07-27 DIAGNOSIS — R5383 Other fatigue: Secondary | ICD-10-CM | POA: Diagnosis not present

## 2021-09-20 DIAGNOSIS — Z1231 Encounter for screening mammogram for malignant neoplasm of breast: Secondary | ICD-10-CM | POA: Diagnosis not present

## 2021-11-27 DIAGNOSIS — L820 Inflamed seborrheic keratosis: Secondary | ICD-10-CM | POA: Diagnosis not present

## 2021-11-27 DIAGNOSIS — I10 Essential (primary) hypertension: Secondary | ICD-10-CM | POA: Diagnosis not present

## 2021-11-27 DIAGNOSIS — Z299 Encounter for prophylactic measures, unspecified: Secondary | ICD-10-CM | POA: Diagnosis not present

## 2021-11-27 DIAGNOSIS — D485 Neoplasm of uncertain behavior of skin: Secondary | ICD-10-CM | POA: Diagnosis not present

## 2021-11-27 DIAGNOSIS — E7800 Pure hypercholesterolemia, unspecified: Secondary | ICD-10-CM | POA: Diagnosis not present

## 2021-11-27 DIAGNOSIS — Z789 Other specified health status: Secondary | ICD-10-CM | POA: Diagnosis not present

## 2021-12-22 ENCOUNTER — Telehealth: Payer: Self-pay

## 2021-12-22 NOTE — Telephone Encounter (Signed)
Pt phoned and LMOVM regarding wanting to be switched to pantoprazole (she stated someone told her) because Omeprazole damages liver. Pt's last ov was 05/04/2020. Pt will need ov.  Returned pt's call and her vm was not set up

## 2021-12-27 NOTE — Telephone Encounter (Signed)
Pt called again today wanting to be switched to pantoprazole instead of Omeprazole. Pt has not been seen in the office since 05/04/2020. Please advise

## 2021-12-28 ENCOUNTER — Encounter: Payer: Self-pay | Admitting: Internal Medicine

## 2021-12-28 MED ORDER — PANTOPRAZOLE SODIUM 40 MG PO TBEC: 40.0000 mg | DELAYED_RELEASE_TABLET | Freq: Every day | ORAL | 3 refills | Status: DC

## 2021-12-28 NOTE — Telephone Encounter (Signed)
Phoned and spoke with the pt. Advised of her Rx being switched to Pantoprazole and her office visit appt. Pt expressed understanding

## 2021-12-28 NOTE — Addendum Note (Signed)
Addended by: Gelene Mink on: 12/28/2021 09:47 AM   Modules accepted: Orders

## 2021-12-28 NOTE — Telephone Encounter (Signed)
I switched to pantoprazole.   Darl Pikes: needs office visit for follow-up, last saw Dr. Marletta Lor.

## 2021-12-28 NOTE — Telephone Encounter (Signed)
OV made, letter mailed 

## 2021-12-29 ENCOUNTER — Telehealth: Payer: Self-pay

## 2021-12-29 NOTE — Telephone Encounter (Signed)
Pt came by the office @ 10:15 am stating that the Rx (pantoprazole) 40 mg was not the strength she wanted. (Pt didn't advise me what strength she was asking for). I asked the pt once again why and who told her about switching and she stated a friend and that she did her own research. She wants to lowest dose pantoprazole that is compatible to her Omeprazole 20 mg. She also stated her research states she cannot cut pills in half. Pt advises Dr Marletta Lor was aware of this information regarding her liver and her trying to avoid liver damage. Pt had been cutting her Omeprazole in half and she wanted to switch because she has been on it for so long. I advised pt that  Dr Marletta Lor out of [place] and Tobi Bastos is off today and Monday. That it will probably be Tuesday before she hears from [place]. Pt expressed understanding. Pt's last ov was 05/04/2020 and she doesn't understand why she needs a ov. Please advise

## 2022-01-02 ENCOUNTER — Other Ambulatory Visit: Payer: Self-pay | Admitting: Internal Medicine

## 2022-01-02 MED ORDER — PANTOPRAZOLE SODIUM 20 MG PO TBEC: 20.0000 mg | DELAYED_RELEASE_TABLET | Freq: Every day | ORAL | 11 refills | Status: DC

## 2022-01-02 NOTE — Telephone Encounter (Signed)
Pantoprazole 20 mg daily sent to pharmacy.  Thank you

## 2022-01-03 NOTE — Telephone Encounter (Signed)
Phoned and advised the pt of her note regarding the Rx being sent to her pharmacy

## 2022-01-03 NOTE — Telephone Encounter (Signed)
Phoned the pt and her mailbox was full. 

## 2022-01-10 ENCOUNTER — Encounter: Payer: Self-pay | Admitting: Internal Medicine

## 2022-01-10 ENCOUNTER — Ambulatory Visit (INDEPENDENT_AMBULATORY_CARE_PROVIDER_SITE_OTHER): Payer: Medicare PPO | Admitting: Internal Medicine

## 2022-01-10 VITALS — BP 131/75 | HR 62 | Temp 97.7°F | Ht 64.0 in | Wt 151.7 lb

## 2022-01-10 DIAGNOSIS — L723 Sebaceous cyst: Secondary | ICD-10-CM | POA: Diagnosis not present

## 2022-01-10 DIAGNOSIS — K219 Gastro-esophageal reflux disease without esophagitis: Secondary | ICD-10-CM

## 2022-01-10 NOTE — Patient Instructions (Signed)
I am happy to hear that you are doing well from a reflux standpoint.  Continue on pantoprazole 20 mg daily.  If you would like referral to see the surgeon Dr. Lovell Sheehan for your sebaceous cysts then call our office and I will send in referral.  Otherwise follow-up in 1 year or sooner if needed.  It was very nice seeing you again today.  Dr. Marletta Lor

## 2022-01-10 NOTE — Progress Notes (Signed)
Referring Provider: Ignatius Specking, MD Primary Care Physician:  Ignatius Specking, MD Primary GI:  Dr. Marletta Lor  Chief Complaint  Patient presents with   Gastroesophageal Reflux    Patient omeprazole 10mg . Cuts 20mg  in half. Wanted to switch med because she was told it was less stress on liver and kidney with other meds. History of RMSF and concerned about taking meds.     HPI:   [name] is a 71 y.o. female who presents to the clinic today for follow-up visit.  She underwent EGD 02/26/2020 for heartburn, cough, hoarseness, and dysphagia.  She had LA grade a esophagitis as well as gastritis, negative for H. pylori.  She was placed on omeprazole 40 mg daily x12 weeks and improved.   Underwent repeat EGD 07/29/2020 to evaluate healing as well as to biopsy for Barrett's esophagus.  Esophagitis had completely healed.  Biopsies showed mild inflammation, no intestinal metaplasia.  Currently on pantoprazole 20 mg daily. Denies any heartburn, dysphagia, odynophagia, epigastric/chest pain.   Colonoscopy 02/25/22 unremarkable with a 10-year recall.  Past Medical History:  Diagnosis Date   Clubbed toes, right    GERD (gastroesophageal reflux disease)    Hypertension     Past Surgical History:  Procedure Laterality Date   ABDOMINAL HYSTERECTOMY     BALLOON DILATION N/A 02/26/2020   Procedure: BALLOON DILATION;  Surgeon: 02/27/22, DO;  [facility];  Service: Endoscopy;  Laterality: N/A;   BIOPSY  02/26/2020   Procedure: BIOPSY;  Surgeon: Lanelle Bal, DO;  [facility];  Service: Endoscopy;;   BIOPSY  07/29/2020   Procedure: BIOPSY;  Surgeon: Lanelle Bal, DO;  [facility];  Service: Endoscopy;;   COLONOSCOPY WITH PROPOFOL N/A 02/26/2020   Procedure: COLONOSCOPY WITH PROPOFOL;  Surgeon: Lanelle Bal, DO;  [facility];  Service: Endoscopy;  Laterality: N/A;  11:00am   ESOPHAGOGASTRODUODENOSCOPY (EGD) WITH PROPOFOL N/A 02/26/2020    Procedure: ESOPHAGOGASTRODUODENOSCOPY (EGD) WITH PROPOFOL;  Surgeon: Lanelle Bal, DO;  [facility];  Service: Endoscopy;  Laterality: N/A;   ESOPHAGOGASTRODUODENOSCOPY (EGD) WITH PROPOFOL N/A 07/29/2020   Procedure: ESOPHAGOGASTRODUODENOSCOPY (EGD) WITH PROPOFOL;  Surgeon: Lanelle Bal, DO;  [facility];  Service: Endoscopy;  Laterality: N/A;  10:30am   FOOT SURGERY Right    NOSE SURGERY      Current Outpatient Medications  Medication Sig Dispense Refill   Apple Cider Vinegar 500 MG TABS Take 500 mg by mouth daily.      Bioflavonoid Products (ESTER C PO) Take 500 mg by mouth daily.     Biotin 1000 MCG tablet Take 1,000 mcg by mouth daily.      Calcium Carb-Cholecalciferol (CALCIUM 600+D) 600-800 MG-UNIT TABS Take 1 tablet by mouth daily.     Cholecalciferol (VITAMIN D) 50 MCG (2000 UT) tablet Take 2,000 Units by mouth daily. Nature made     Multiple Vitamins-Minerals (PRESERVISION AREDS 2) CAPS Take 1 capsule by mouth every other day.     omeprazole (PRILOSEC) 10 MG capsule Take 20 mg by mouth daily. Cuts 20mg  in half ( 10mg  daily)     Prenatal Vit-Fe Fumarate-FA (PRENATAL PO) Take 1 tablet by mouth daily as needed (when not taking the other supplements).      SUPER B COMPLEX/C PO Take 1 tablet by mouth daily.     valsartan (DIOVAN) 160 MG tablet Take 160 mg by mouth daily.     vitamin  E 180 MG (400 UNITS) capsule Take 400 Units by mouth daily.     zinc gluconate 50 MG tablet Take 50 mg by mouth daily.      pantoprazole (PROTONIX) 20 MG tablet Take 1 tablet (20 mg total) by mouth daily. (Patient not taking: Reported on 01/10/2022) 30 tablet 11   No current facility-administered medications for this visit.    Allergies as of 01/10/2022 - Review Complete 01/10/2022  Allergen Reaction Noted   Ibuprofen  03/13/2010   Nsaids  01/31/2018    Family History  Problem Relation Age of Onset   Stomach cancer Father    Diverticulitis Sister    Heart disease  Brother     Social History   Socioeconomic History   Marital status: Married    Spouse name: Not on file   Number of children: Not on file   Years of education: Not on file   Highest education level: Not on file  Occupational History   Not on file  Tobacco Use   Smoking status: Never    Passive exposure: Never   Smokeless tobacco: Never  Vaping Use   Vaping Use: Never used  Substance and Sexual Activity   Alcohol use: Yes    Comment: maybe 1 a week   Drug use: Never   Sexual activity: Not on file  Other Topics Concern   Not on file  Social History Narrative   Not on file   Social Determinants of Health   Financial Resource Strain: Not on file  Food Insecurity: Not on file  Transportation Needs: Not on file  Physical Activity: Not on file  Stress: Not on file  Social Connections: Not on file    Subjective: Review of Systems  Constitutional:  Negative for chills and fever.  HENT:  Negative for congestion and hearing loss.   Eyes:  Negative for blurred vision and double vision.  Respiratory:  Negative for cough and shortness of breath.   Cardiovascular:  Negative for chest pain and palpitations.  Gastrointestinal:  Negative for abdominal pain, blood in stool, constipation, diarrhea, heartburn, melena and vomiting.  Genitourinary:  Negative for dysuria and urgency.  Musculoskeletal:  Negative for joint pain and myalgias.  Skin:  Negative for itching and rash.  Neurological:  Negative for dizziness and headaches.  Psychiatric/Behavioral:  Negative for depression. The patient is not nervous/anxious.      Objective: BP 131/75 (BP Location: Left Arm, Patient Position: Sitting, Cuff Size: Large)   Pulse 62   Temp 97.7 F (36.5 C) (Oral)   Ht 5\' 4"  (1.626 m)   Wt 151 lb 11.2 oz (68.8 kg)   BMI 26.04 kg/m  Physical Exam Constitutional:      Appearance: Normal appearance.  HENT:     Head: Normocephalic and atraumatic.  Eyes:     Extraocular Movements:  Extraocular movements intact.     Conjunctiva/sclera: Conjunctivae normal.  Cardiovascular:     Rate and Rhythm: Normal rate and regular rhythm.  Pulmonary:     Effort: Pulmonary effort is normal.     Breath sounds: Normal breath sounds.  Abdominal:     General: Bowel sounds are normal.     Palpations: Abdomen is soft.  Musculoskeletal:        General: No swelling. Normal range of motion.     Cervical back: Normal range of motion and neck supple.  Skin:    General: Skin is warm and dry.     Coloration: Skin is not jaundiced.  Neurological:     General: No focal deficit present.     Mental Status: She is alert and oriented to person, place, and time.  Psychiatric:        Mood and Affect: Mood normal.        Behavior: Behavior normal.      Assessment: *Chronic GERD-well controlled on pantoprazole 20 mg daily *Anal itching *Sebaceous cysts  Plan: Patient symptoms vastly improved on PPI therapy.  We will continue Pantoprazole 20 mg daily.   Patient complaining about previously diagnosed small sebaceous cysts around her anus.  Offered referral to general surgery for evaluation she states she would like to hold off for now.  If she wishes to have these addressed in the future, all she has to do is call our office and we will send a referral.  Otherwise follow-up in 1 year  01/10/2022 2:23 PM   Disclaimer: This note was dictated with voice recognition software. Similar sounding words can inadvertently be transcribed and may not be corrected upon review.

## 2022-04-03 DIAGNOSIS — Z299 Encounter for prophylactic measures, unspecified: Secondary | ICD-10-CM | POA: Diagnosis not present

## 2022-04-03 DIAGNOSIS — I10 Essential (primary) hypertension: Secondary | ICD-10-CM | POA: Diagnosis not present

## 2022-04-03 DIAGNOSIS — R42 Dizziness and giddiness: Secondary | ICD-10-CM | POA: Diagnosis not present

## 2022-07-17 DIAGNOSIS — Z1331 Encounter for screening for depression: Secondary | ICD-10-CM | POA: Diagnosis not present

## 2022-07-17 DIAGNOSIS — E7800 Pure hypercholesterolemia, unspecified: Secondary | ICD-10-CM | POA: Diagnosis not present

## 2022-07-17 DIAGNOSIS — Z1339 Encounter for screening examination for other mental health and behavioral disorders: Secondary | ICD-10-CM | POA: Diagnosis not present

## 2022-07-17 DIAGNOSIS — R5383 Other fatigue: Secondary | ICD-10-CM | POA: Diagnosis not present

## 2022-07-17 DIAGNOSIS — Z7189 Other specified counseling: Secondary | ICD-10-CM | POA: Diagnosis not present

## 2022-07-17 DIAGNOSIS — Z0000 Encounter for general adult medical examination without abnormal findings: Secondary | ICD-10-CM | POA: Diagnosis not present

## 2022-07-17 DIAGNOSIS — I10 Essential (primary) hypertension: Secondary | ICD-10-CM | POA: Diagnosis not present

## 2022-07-17 DIAGNOSIS — Z79899 Other long term (current) drug therapy: Secondary | ICD-10-CM | POA: Diagnosis not present

## 2022-07-17 DIAGNOSIS — Z299 Encounter for prophylactic measures, unspecified: Secondary | ICD-10-CM | POA: Diagnosis not present

## 2022-07-26 DIAGNOSIS — H43811 Vitreous degeneration, right eye: Secondary | ICD-10-CM | POA: Diagnosis not present

## 2022-08-24 DIAGNOSIS — M859 Disorder of bone density and structure, unspecified: Secondary | ICD-10-CM | POA: Diagnosis not present

## 2022-08-24 DIAGNOSIS — Z79899 Other long term (current) drug therapy: Secondary | ICD-10-CM | POA: Diagnosis not present

## 2022-08-24 DIAGNOSIS — E2839 Other primary ovarian failure: Secondary | ICD-10-CM | POA: Diagnosis not present

## 2022-09-27 DIAGNOSIS — Z01419 Encounter for gynecological examination (general) (routine) without abnormal findings: Secondary | ICD-10-CM | POA: Diagnosis not present

## 2022-09-27 DIAGNOSIS — Z6826 Body mass index (BMI) 26.0-26.9, adult: Secondary | ICD-10-CM | POA: Diagnosis not present

## 2022-09-27 DIAGNOSIS — Z1231 Encounter for screening mammogram for malignant neoplasm of breast: Secondary | ICD-10-CM | POA: Diagnosis not present

## 2022-11-22 DIAGNOSIS — I10 Essential (primary) hypertension: Secondary | ICD-10-CM | POA: Diagnosis not present

## 2022-11-22 DIAGNOSIS — Z6826 Body mass index (BMI) 26.0-26.9, adult: Secondary | ICD-10-CM | POA: Diagnosis not present

## 2022-11-22 DIAGNOSIS — Z299 Encounter for prophylactic measures, unspecified: Secondary | ICD-10-CM | POA: Diagnosis not present

## 2023-01-07 ENCOUNTER — Encounter: Payer: Self-pay | Admitting: Internal Medicine

## 2023-02-05 ENCOUNTER — Other Ambulatory Visit: Payer: Self-pay | Admitting: Internal Medicine

## 2023-02-06 ENCOUNTER — Other Ambulatory Visit: Payer: Self-pay

## 2023-04-03 ENCOUNTER — Ambulatory Visit: Payer: Medicare PPO | Admitting: Internal Medicine

## 2023-04-09 DIAGNOSIS — Z299 Encounter for prophylactic measures, unspecified: Secondary | ICD-10-CM | POA: Diagnosis not present

## 2023-04-09 DIAGNOSIS — I10 Essential (primary) hypertension: Secondary | ICD-10-CM | POA: Diagnosis not present

## 2023-04-09 DIAGNOSIS — R42 Dizziness and giddiness: Secondary | ICD-10-CM | POA: Diagnosis not present

## 2023-04-09 DIAGNOSIS — Z6826 Body mass index (BMI) 26.0-26.9, adult: Secondary | ICD-10-CM | POA: Diagnosis not present

## 2023-04-17 ENCOUNTER — Encounter: Payer: Self-pay | Admitting: Podiatry

## 2023-04-17 ENCOUNTER — Ambulatory Visit: Payer: Medicare PPO | Admitting: Podiatry

## 2023-04-17 ENCOUNTER — Ambulatory Visit (INDEPENDENT_AMBULATORY_CARE_PROVIDER_SITE_OTHER): Payer: Medicare PPO

## 2023-04-17 DIAGNOSIS — M79672 Pain in left foot: Secondary | ICD-10-CM

## 2023-04-17 DIAGNOSIS — M7662 Achilles tendinitis, left leg: Secondary | ICD-10-CM | POA: Diagnosis not present

## 2023-04-17 MED ORDER — TRIAMCINOLONE ACETONIDE 10 MG/ML IJ SUSP
10.0000 mg | Freq: Once | INTRAMUSCULAR | Status: AC
Start: 2023-04-17 — End: 2023-04-17
  Administered 2023-04-17: 10 mg via INTRA_ARTICULAR

## 2023-04-17 NOTE — Patient Instructions (Signed)

## 2023-04-18 NOTE — Progress Notes (Signed)
Subjective:   Patient ID: [name], female   DOB: 72 y.o.   MRN: [id number]   HPI Patient presents stating she has had a lot of pain in the back of her left heel and she is on her foot a lot as her husband has been sick.  States it is hard for her to do her walking and she needs to be able to walk.  Patient does not smoke likes to be active   Review of Systems  All other systems reviewed and are negative.       Objective:  Physical Exam Vitals and nursing note reviewed.  Constitutional:      Appearance: She is well-developed.  Pulmonary:     Effort: Pulmonary effort is normal.  Musculoskeletal:        General: Normal range of motion.  Skin:    General: Skin is warm.  Neurological:     Mental Status: She is alert.     Neurovascular status was found to be intact muscle strength was found to be adequate range of motion adequate with patient noted to have inflammation pain of the posterior aspect of the left heel medial side at the insertion of the Achilles into this area with the center and lateral side not having the same types of issues.  Good digital perfusion well-oriented x 3     Assessment:  Acute Achilles tendinitis left medial side     Plan:  H&P reviewed discussed treatment options at this point we are going to try to reduce the inflammation with medication I explained risk of doing this patient wants the procedure and I did sterile prep and after explaining the risk of this and possibility for rupture I carefully injected the medial side 3 mg dexamethasone Kenalog 5 mg Xylocaine advised on reduced activity ice therapy and heel lift which was dispensed.  Gave instructions on stretching exercises  X-rays indicate minimal spur appears to be inflammatory

## 2023-04-24 ENCOUNTER — Ambulatory Visit (INDEPENDENT_AMBULATORY_CARE_PROVIDER_SITE_OTHER): Payer: Medicare PPO | Admitting: Internal Medicine

## 2023-04-24 ENCOUNTER — Telehealth: Payer: Self-pay

## 2023-04-24 ENCOUNTER — Encounter: Payer: Self-pay | Admitting: Internal Medicine

## 2023-04-24 VITALS — BP 145/75 | HR 74 | Temp 97.5°F | Ht 65.5 in | Wt 158.8 lb

## 2023-04-24 DIAGNOSIS — K5902 Outlet dysfunction constipation: Secondary | ICD-10-CM

## 2023-04-24 DIAGNOSIS — Z1211 Encounter for screening for malignant neoplasm of colon: Secondary | ICD-10-CM

## 2023-04-24 DIAGNOSIS — R150 Incomplete defecation: Secondary | ICD-10-CM | POA: Diagnosis not present

## 2023-04-24 DIAGNOSIS — R194 Change in bowel habit: Secondary | ICD-10-CM | POA: Diagnosis not present

## 2023-04-24 DIAGNOSIS — K219 Gastro-esophageal reflux disease without esophagitis: Secondary | ICD-10-CM | POA: Diagnosis not present

## 2023-04-24 MED ORDER — PANTOPRAZOLE SODIUM 20 MG PO TBEC: 20.0000 mg | DELAYED_RELEASE_TABLET | Freq: Every day | ORAL | 3 refills | Status: DC

## 2023-04-24 NOTE — Progress Notes (Signed)
Referring Provider: Ignatius Specking, MD Primary Care Physician:  Ignatius Specking, MD Primary GI:  Dr. Marletta Lor  Chief Complaint  Patient presents with   Follow-up    Patient here today for a follow up on her Genella Rife. Patient states her Pantoprazole 20 mg is working well for her. She says this is currently working for her. Will need 90 day supply sent to [facility] on  [address] in [place], [place].    HPI:   [name] is a 72 y.o. female who presents to the clinic today for follow-up visit.    Chronic GERD: Well controlled on pantoprazole 20 mg daily.   EGD 02/26/2020 with LA grade a esophagitis as well as gastritis, negative for H. pylori.  She was placed on PPI BID x12 weeks and improved.   EGD 07/29/2020 to evaluate healing as well as to biopsy for Barrett's esophagus.  Esophagitis had completely healed.  Biopsies showed mild inflammation, no intestinal metaplasia.  Currently on pantoprazole 20 mg daily. Denies any heartburn, dysphagia, odynophagia, epigastric/chest pain.   Colon cancer screening: Colonoscopy 02/25/22 unremarkable with a 10-year recall.  Today complaining of issues with defecation.  States she feels like she has a "restriction" in her rectum.  Report having skinny ropelike stools often.  Incomplete evacuation.  Denies constipation per se as she states she has a bowel movement every day though these are difficult.  Past Medical History:  Diagnosis Date   Clubbed toes, right    GERD (gastroesophageal reflux disease)    Hypertension     Past Surgical History:  Procedure Laterality Date   ABDOMINAL HYSTERECTOMY     BALLOON DILATION N/A 02/26/2020   Procedure: BALLOON DILATION;  Surgeon: Lanelle Bal, DO;  [facility];  Service: Endoscopy;  Laterality: N/A;   BIOPSY  02/26/2020   Procedure: BIOPSY;  Surgeon: Lanelle Bal, DO;  [facility];  Service: Endoscopy;;   BIOPSY  07/29/2020   Procedure: BIOPSY;  Surgeon: Lanelle Bal,  DO;  [facility];  Service: Endoscopy;;   COLONOSCOPY WITH PROPOFOL N/A 02/26/2020   Procedure: COLONOSCOPY WITH PROPOFOL;  Surgeon: Lanelle Bal, DO;  [facility];  Service: Endoscopy;  Laterality: N/A;  11:00am   ESOPHAGOGASTRODUODENOSCOPY (EGD) WITH PROPOFOL N/A 02/26/2020   Procedure: ESOPHAGOGASTRODUODENOSCOPY (EGD) WITH PROPOFOL;  Surgeon: Lanelle Bal, DO;  [facility];  Service: Endoscopy;  Laterality: N/A;   ESOPHAGOGASTRODUODENOSCOPY (EGD) WITH PROPOFOL N/A 07/29/2020   Procedure: ESOPHAGOGASTRODUODENOSCOPY (EGD) WITH PROPOFOL;  Surgeon: Lanelle Bal, DO;  [facility];  Service: Endoscopy;  Laterality: N/A;  10:30am   FOOT SURGERY Right    NOSE SURGERY      Current Outpatient Medications  Medication Sig Dispense Refill   Bioflavonoid Products (ESTER C PO) Take 500 mg by mouth daily.     Biotin 1000 MCG tablet Take 1,000 mcg by mouth daily.      Calcium Carb-Cholecalciferol (CALCIUM 600+D) 600-800 MG-UNIT TABS Take 1 tablet by mouth daily.     Cholecalciferol (VITAMIN D) 50 MCG (2000 UT) tablet Take by mouth daily. 3,000 Nature made     magnesium 30 MG tablet Take 30 mg by mouth daily at 6 (six) AM.     Multiple Vitamins-Minerals (PRESERVISION AREDS 2) CAPS Take 1 capsule by mouth every other day.     OVER THE COUNTER MEDICATION CQ 10 daily     pantoprazole (PROTONIX) 20 MG tablet TAKE 1  TABLET(20 MG) BY MOUTH DAILY 30 tablet 11   Prenatal Vit-Fe Fumarate-FA (PRENATAL PO) Take 1 tablet by mouth daily as needed (when not taking the other supplements).      SUPER B COMPLEX/C PO Take 1 tablet by mouth daily.     valsartan (DIOVAN) 160 MG tablet Take 160 mg by mouth daily.     vitamin E 180 MG (400 UNITS) capsule Take 400 Units by mouth daily.     zinc gluconate 50 MG tablet Take 50 mg by mouth daily.      No current facility-administered medications for this visit.    Allergies as of 04/24/2023 - Review Complete  04/24/2023  Allergen Reaction Noted   Ibuprofen  03/13/2010   Nsaids  01/31/2018    Family History  Problem Relation Age of Onset   Stomach cancer Father    Diverticulitis Sister    Heart disease Brother     Social History   Socioeconomic History   Marital status: Married    Spouse name: Not on file   Number of children: Not on file   Years of education: Not on file   Highest education level: Not on file  Occupational History   Not on file  Tobacco Use   Smoking status: Never    Passive exposure: Never   Smokeless tobacco: Never  Vaping Use   Vaping status: Never Used  Substance and Sexual Activity   Alcohol use: Yes    Comment: maybe 1 a week   Drug use: Never   Sexual activity: Not on file  Other Topics Concern   Not on file  Social History Narrative   Not on file   Social Determinants of Health   Financial Resource Strain: Not on file  Food Insecurity: Not on file  Transportation Needs: Not on file  Physical Activity: Not on file  Stress: Not on file  Social Connections: Not on file    Subjective: Review of Systems  Constitutional:  Negative for chills and fever.  HENT:  Negative for congestion and hearing loss.   Eyes:  Negative for blurred vision and double vision.  Respiratory:  Negative for cough and shortness of breath.   Cardiovascular:  Negative for chest pain and palpitations.  Gastrointestinal:  Negative for abdominal pain, blood in stool, constipation, diarrhea, heartburn, melena and vomiting.  Genitourinary:  Negative for dysuria and urgency.  Musculoskeletal:  Negative for joint pain and myalgias.  Skin:  Negative for itching and rash.  Neurological:  Negative for dizziness and headaches.  Psychiatric/Behavioral:  Negative for depression. The patient is not nervous/anxious.      Objective: BP (!) 145/75 (BP Location: Left Arm, Patient Position: Sitting, Cuff Size: Normal)   Pulse 74   Temp (!) 97.5 F (36.4 C) (Temporal)   Ht 5'  5.5" (1.664 m)   Wt 158 lb 12.8 oz (72 kg)   BMI 26.02 kg/m  Physical Exam Constitutional:      Appearance: Normal appearance.  HENT:     Head: Normocephalic and atraumatic.  Eyes:     Extraocular Movements: Extraocular movements intact.     Conjunctiva/sclera: Conjunctivae normal.  Cardiovascular:     Rate and Rhythm: Normal rate and regular rhythm.  Pulmonary:     Effort: Pulmonary effort is normal.     Breath sounds: Normal breath sounds.  Abdominal:     General: Bowel sounds are normal.     Palpations: Abdomen is soft.  Musculoskeletal:  General: No swelling. Normal range of motion.     Cervical back: Normal range of motion and neck supple.  Skin:    General: Skin is warm and dry.     Coloration: Skin is not jaundiced.  Neurological:     General: No focal deficit present.     Mental Status: She is alert and oriented to person, place, and time.  Psychiatric:        Mood and Affect: Mood normal.        Behavior: Behavior normal.   Digital rectal exam: Small external hemorrhoid, no excoriation inflammation perianally.  Anal sphincter appears tight on rectal exam.  No masses felt.   Assessment: *Chronic GERD-well controlled on pantoprazole 20 mg daily *Colon cancer screening *Change in bowels, difficulty with defecation  Plan: Patient symptoms vastly improved on PPI therapy.  We will continue Pantoprazole 20 mg daily. Refilled today.   Colonoscopy recall 2033  Discussed change in bowels and difficulty with defecation patient today.  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  Recommended anorectal manometry to further evaluate.  Patient would like to hold off for now as she is primary caregiver for her husband who is undergoing aggressive treatment for lymphoma with plans for stem cell transplant in the near future.  Patient to call office when she would like to have done and I will order.  Otherwise follow-up in 1 year  04/24/2023 3:25 PM   Disclaimer: This note was  dictated with voice recognition software. Similar sounding words can inadvertently be transcribed and may not be corrected upon review.

## 2023-04-24 NOTE — Patient Instructions (Addendum)
  I am happy to hear that you are doing well from a reflux standpoint.  Continue on pantoprazole 20 mg daily. I have sent in a year supply today  When you feel like is a good time to further evaluate your rectal issues/change in bowels, let me know and I can set this up.  Otherwise, follow-up in 1 year or sooner if needed.  It was very nice seeing you again today. I will be praying for your Husband.   Dr. Marletta Lor

## 2023-04-24 NOTE — Telephone Encounter (Signed)
Patient called, asking what was in the injection she received. Advised it was 3 mg dexamethasone, Kenalog 5 mg , and Xylocaine. She is very pleased with her results. Her pain is gone in that foot. Advised to call back with any further questions . Thanks

## 2023-04-29 ENCOUNTER — Telehealth: Payer: Self-pay | Admitting: *Deleted

## 2023-04-29 DIAGNOSIS — K5902 Outlet dysfunction constipation: Secondary | ICD-10-CM

## 2023-04-29 NOTE — Telephone Encounter (Signed)
Just to be clear, this is for anorectal manometry. Does she need a referral for this or can we just order the study? Thank you

## 2023-04-29 NOTE — Telephone Encounter (Signed)
Patient came in asking how far out [facility] is booking for manometry. I advised her we have been seeing it is about March or April. She would like for [place] to go ahead and send a referral. I have placed to [facility]. Fyi Dr. Marletta Lor

## 2023-04-30 NOTE — Telephone Encounter (Signed)
A referral has to be done to [facility] for the procedure and I out anorectal manometry in the notes of referral.

## 2023-04-30 NOTE — Telephone Encounter (Signed)
Great thank you!

## 2023-06-04 DIAGNOSIS — Z299 Encounter for prophylactic measures, unspecified: Secondary | ICD-10-CM | POA: Diagnosis not present

## 2023-06-04 DIAGNOSIS — R252 Cramp and spasm: Secondary | ICD-10-CM | POA: Diagnosis not present

## 2023-06-04 DIAGNOSIS — Z6826 Body mass index (BMI) 26.0-26.9, adult: Secondary | ICD-10-CM | POA: Diagnosis not present

## 2023-06-04 DIAGNOSIS — I10 Essential (primary) hypertension: Secondary | ICD-10-CM | POA: Diagnosis not present

## 2023-06-04 DIAGNOSIS — M2550 Pain in unspecified joint: Secondary | ICD-10-CM | POA: Diagnosis not present

## 2023-06-26 ENCOUNTER — Telehealth: Payer: Self-pay | Admitting: Gastroenterology

## 2023-06-26 ENCOUNTER — Other Ambulatory Visit: Payer: Self-pay

## 2023-06-26 DIAGNOSIS — K5902 Outlet dysfunction constipation: Secondary | ICD-10-CM

## 2023-06-26 NOTE — Telephone Encounter (Signed)
PT has been referred to [place] for an anorectal manometry. Please schedule accordingly. Thank you.

## 2023-06-26 NOTE — Telephone Encounter (Signed)
10/09/23 at 8:30 am

## 2023-06-27 ENCOUNTER — Other Ambulatory Visit: Payer: Self-pay

## 2023-06-27 NOTE — Telephone Encounter (Signed)
Referral for anorectal manometry Referring provider Dr Marletta Lor Appointment and procedure information to the patient.

## 2023-07-03 NOTE — Telephone Encounter (Signed)
Patient called requesting to speak with a nurse regarding her prep instructions.

## 2023-07-04 NOTE — Telephone Encounter (Signed)
Spoke with the patient. Reviewed instruction given to her for her anorectal manometry.

## 2023-08-07 ENCOUNTER — Telehealth: Payer: Self-pay | Admitting: Gastroenterology

## 2023-08-07 NOTE — Telephone Encounter (Signed)
 Patient called in stating she is having some GI issues such as change in bowel movement and states she feels like it's a blockage. Patient states she is having an opportunity to schedule with a gynecology on April 25th. She is wondering if it'll be an interference and is wanting to speak with a nurse for further information. Thank you.

## 2023-08-08 DIAGNOSIS — I10 Essential (primary) hypertension: Secondary | ICD-10-CM | POA: Diagnosis not present

## 2023-08-08 DIAGNOSIS — Z0000 Encounter for general adult medical examination without abnormal findings: Secondary | ICD-10-CM | POA: Diagnosis not present

## 2023-08-08 DIAGNOSIS — R5383 Other fatigue: Secondary | ICD-10-CM | POA: Diagnosis not present

## 2023-08-08 DIAGNOSIS — Z299 Encounter for prophylactic measures, unspecified: Secondary | ICD-10-CM | POA: Diagnosis not present

## 2023-08-08 DIAGNOSIS — Z1339 Encounter for screening examination for other mental health and behavioral disorders: Secondary | ICD-10-CM | POA: Diagnosis not present

## 2023-08-08 DIAGNOSIS — M8580 Other specified disorders of bone density and structure, unspecified site: Secondary | ICD-10-CM | POA: Diagnosis not present

## 2023-08-08 DIAGNOSIS — Z7189 Other specified counseling: Secondary | ICD-10-CM | POA: Diagnosis not present

## 2023-08-08 DIAGNOSIS — Z79899 Other long term (current) drug therapy: Secondary | ICD-10-CM | POA: Diagnosis not present

## 2023-08-08 DIAGNOSIS — E7800 Pure hypercholesterolemia, unspecified: Secondary | ICD-10-CM | POA: Diagnosis not present

## 2023-08-08 DIAGNOSIS — Z1331 Encounter for screening for depression: Secondary | ICD-10-CM | POA: Diagnosis not present

## 2023-08-08 NOTE — Telephone Encounter (Signed)
No answer. Mailbox is full and cannot accept any messages.

## 2023-08-15 DIAGNOSIS — Z299 Encounter for prophylactic measures, unspecified: Secondary | ICD-10-CM | POA: Diagnosis not present

## 2023-08-15 DIAGNOSIS — B078 Other viral warts: Secondary | ICD-10-CM | POA: Diagnosis not present

## 2023-08-15 DIAGNOSIS — I10 Essential (primary) hypertension: Secondary | ICD-10-CM | POA: Diagnosis not present

## 2023-08-22 ENCOUNTER — Telehealth: Payer: Self-pay | Admitting: Gastroenterology

## 2023-08-22 NOTE — Telephone Encounter (Signed)
 Inbound call from patient requesting to speak with [facility] regarding upcoming procedure. Requesting to discuss if procedure will help in inflammation. Please advise, thank you.

## 2023-08-22 NOTE — Telephone Encounter (Signed)
 Patient referred to [facility] for anorectal manometry. She is considering having a GYN consult as well because of the pressure and bulging she feels in the vaginal area when she bears down for her bowel movement. She wants to know if this is okay, would it be useful information? I told the patient it could be useful information and could definitely be reassuring since she is concerned.

## 2023-09-30 ENCOUNTER — Telehealth: Payer: Self-pay

## 2023-09-30 NOTE — Telephone Encounter (Signed)
 Procedure:Colon Procedure date: 10/09/23 Procedure location: [facility] Arrival Time: 7:00 Spoke with the patient Y/N: Y Any prep concerns? N  Has the patient obtained the prep from the pharmacy ? Y Do you have a care partner and transportation: Y Any additional concerns? N

## 2023-10-01 DIAGNOSIS — Z1231 Encounter for screening mammogram for malignant neoplasm of breast: Secondary | ICD-10-CM | POA: Diagnosis not present

## 2023-10-01 DIAGNOSIS — Z01419 Encounter for gynecological examination (general) (routine) without abnormal findings: Secondary | ICD-10-CM | POA: Diagnosis not present

## 2023-10-04 ENCOUNTER — Other Ambulatory Visit: Payer: Self-pay | Admitting: Obstetrics & Gynecology

## 2023-10-04 DIAGNOSIS — R928 Other abnormal and inconclusive findings on diagnostic imaging of breast: Secondary | ICD-10-CM

## 2023-10-08 ENCOUNTER — Ambulatory Visit
Admission: RE | Admit: 2023-10-08 | Discharge: 2023-10-08 | Discharge status: Home / Self Care | Source: Ambulatory Visit | Attending: Obstetrics & Gynecology | Admitting: Obstetrics & Gynecology

## 2023-10-08 DIAGNOSIS — N6002 Solitary cyst of left breast: Secondary | ICD-10-CM | POA: Diagnosis not present

## 2023-10-08 DIAGNOSIS — R928 Other abnormal and inconclusive findings on diagnostic imaging of breast: Secondary | ICD-10-CM

## 2023-10-08 DIAGNOSIS — N6321 Unspecified lump in the left breast, upper outer quadrant: Secondary | ICD-10-CM | POA: Diagnosis not present

## 2023-10-09 ENCOUNTER — Encounter (HOSPITAL_COMMUNITY): Payer: Self-pay | Admitting: Gastroenterology

## 2023-10-09 ENCOUNTER — Encounter (HOSPITAL_COMMUNITY)
Admission: RE | Disposition: A | Discharge status: Home / Self Care | Payer: Self-pay | Source: Home / Self Care | Attending: Gastroenterology

## 2023-10-09 ENCOUNTER — Ambulatory Visit (HOSPITAL_COMMUNITY)
Admission: RE | Admit: 2023-10-09 | Discharge: 2023-10-09 | Disposition: A | Discharge status: Home / Self Care | Payer: Medicare PPO | Attending: Gastroenterology | Admitting: Gastroenterology

## 2023-10-09 DIAGNOSIS — K6289 Other specified diseases of anus and rectum: Secondary | ICD-10-CM | POA: Diagnosis not present

## 2023-10-09 DIAGNOSIS — K5902 Outlet dysfunction constipation: Secondary | ICD-10-CM

## 2023-10-09 DIAGNOSIS — K5900 Constipation, unspecified: Secondary | ICD-10-CM

## 2023-10-09 HISTORY — PX: ANAL RECTAL MANOMETRY: SHX6358

## 2023-10-09 SURGERY — MANOMETRY, ANORECTAL

## 2023-10-09 NOTE — Progress Notes (Signed)
Anal manometry done per protocol. Pt tolerated well without distress or complication   

## 2023-10-17 DIAGNOSIS — H25012 Cortical age-related cataract, left eye: Secondary | ICD-10-CM | POA: Diagnosis not present

## 2023-11-11 ENCOUNTER — Telehealth: Payer: Self-pay

## 2023-11-11 DIAGNOSIS — K5902 Outlet dysfunction constipation: Secondary | ICD-10-CM

## 2023-11-11 NOTE — Telephone Encounter (Signed)
 Pt's High Resolution Anorectal Motility Study from May 2025 is in the procedures of her chart. Pt is calling wanting to know her next steps of treatment. Please advise. Pt's DOB 01/01/1951--[name]

## 2023-11-11 NOTE — Telephone Encounter (Signed)
 error

## 2023-11-12 NOTE — Telephone Encounter (Signed)
 Phoned the pt and advised her of the referral that has been placed for her.

## 2023-11-12 NOTE — Addendum Note (Signed)
 Addended by: Feliz Hosteller on: 11/12/2023 10:54 AM   Modules accepted: Orders

## 2023-11-12 NOTE — Telephone Encounter (Addendum)
 Per Dr. Mordechai April "She needs pelvic floor/biofeedback therapy". Referral placed. Referral placed in chart.

## 2023-11-13 NOTE — Therapy (Signed)
 OUTPATIENT PHYSICAL THERAPY FEMALE PELVIC EVALUATION   Patient Name: [name] MRN: [id number] DOB:05/11/1951, 73 y.o., female Today's Date: 11/14/2023  END OF SESSION:  PT End of Session - 11/14/23 1715     Visit Number 1    Date for PT Re-Evaluation 02/14/24    Authorization Type [employer]IC[employer] CHOICE PPO    Authorization Time Period waiting on auth    PT Start Time 1540    PT Stop Time 1615    PT Time Calculation (min) 35 min    Activity Tolerance Patient tolerated treatment well;No increased pain    Behavior During Therapy Anxious          Past Medical History:  Diagnosis Date   Clubbed toes, right    GERD (gastroesophageal reflux disease)    Hypertension    Past Surgical History:  Procedure Laterality Date   ABDOMINAL HYSTERECTOMY     ANAL RECTAL MANOMETRY N/A 10/09/2023   Procedure: MANOMETRY, ANORECTAL;  Surgeon: Sergio Dandy, MD;  Location: [facility];  Service: Gastroenterology;  Laterality: N/A;   BALLOON DILATION N/A 02/26/2020   Procedure: BALLOON DILATION;  Surgeon: Vinetta Greening, DO;  Location: [facility];  Service: Endoscopy;  Laterality: N/A;   BIOPSY  02/26/2020   Procedure: BIOPSY;  Surgeon: Vinetta Greening, DO;  Location: [facility];  Service: Endoscopy;;   BIOPSY  07/29/2020   Procedure: BIOPSY;  Surgeon: Vinetta Greening, DO;  Location: [facility];  Service: Endoscopy;;   COLONOSCOPY WITH PROPOFOL  N/A 02/26/2020   Procedure: COLONOSCOPY WITH PROPOFOL ;  Surgeon: Vinetta Greening, DO;  Location: [facility];  Service: Endoscopy;  Laterality: N/A;  11:00am   ESOPHAGOGASTRODUODENOSCOPY (EGD) WITH PROPOFOL  N/A 02/26/2020   Procedure: ESOPHAGOGASTRODUODENOSCOPY (EGD) WITH PROPOFOL ;  Surgeon: Vinetta Greening, DO;  Location: [facility];  Service: Endoscopy;  Laterality: N/A;   ESOPHAGOGASTRODUODENOSCOPY (EGD) WITH PROPOFOL  N/A 07/29/2020   Procedure: ESOPHAGOGASTRODUODENOSCOPY (EGD) WITH PROPOFOL ;  Surgeon: Vinetta Greening, DO;   Location: [facility];  Service: Endoscopy;  Laterality: N/A;  10:30am   FOOT SURGERY Right    NOSE SURGERY     Patient Active Problem List   Diagnosis Date Noted   Esophagitis, [place] grade A 05/04/2020   Gastritis 05/04/2020   Hiatal hernia 01/14/2020   Dyspepsia 01/14/2020   Colon cancer screening 01/14/2020   HEPATITIS, CHRONIC PERSISTENT 07/17/2010   [place] SPOTTED FEVER 05/15/2010   Essential hypertension 05/15/2010   CHRONIC FATIGUE SYNDROME 05/15/2010   PALPITATIONS, RECURRENT 05/15/2010   HOT FLASHES 03/13/2010   MYALGIA 03/13/2010   PCP: Orlena Bitters, MD  REFERRING PROVIDER: Vinetta Greening, DO  REFERRING DIAG: [phone] (ICD-10-CM) - Dyssynergic defecation  THERAPY DIAG:  Other lack of coordination  Muscle weakness (generalized)  Rationale for Evaluation and Treatment: Rehabilitation  ONSET DATE: 2020  SUBJECTIVE:  SUBJECTIVE STATEMENT: Pt reports that she told her doctor that she had restricted bowel before her last colonoscopy and he saw internal inflamed hemorrhoids. She stated that she is lucky if she gets a bowl movement as narrow as her pinky, She gets bloated, feels pressure build up, has to go 3-4 times in the morning, lots of gas. Never feels completely empty.  When he did an exam he saw an evidence of something restricted. Pt could not pass the balloon thing after.  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 She reports that she feels like something else could be going on, she does not like to be a fatalist, but she feels like something is going on. She feels like she needs to have an ultrasound.  Feels like something hormonal is going on.  Pt recently widowed.  She has been eating rice and  chicken and has type 4 bristol stool scale  Fluid intake: water , coffee, soda when travelling, milk  PAIN:  Are you having pain? Yes in her heel after a lot of yard work   PRECAUTIONS: None  RED FLAGS: None   WEIGHT BEARING RESTRICTIONS: No  FALLS:  Has patient fallen in last 6 months? No  OCCUPATION: retired, has an Health visitor at [employer]  ACTIVITY LEVEL : house and a lot of yardwork, antique booth  PLOF: Independent  PATIENT GOALS: to have more normal bowel movements like before  PERTINENT HISTORY:  10/09/2023 Anal rectal manometry (N/A)  07/29/2020 Esophagogastroduodenoscopy (egd) with propofol  (N/A)  07/29/2020 Biopsy  02/26/2020 Colonoscopy with propofol  (N/A)  02/26/2020 Esophagogastroduodenoscopy (egd) with propofol  (N/A)  02/26/2020 Balloon dilation (N/A)  02/26/2020 Biopsy  Date Unknown Abdominal hysterectomy Date Unknown Foot surgery (Right) Date Unknown Nose surgery Sexual abuse: No  BOWEL MOVEMENT: has a bowel movement every day Pain with bowel movement: No Type of bowel movement:Type ([place] Stool Scale) 4-7 Fully empty rectum: No Leakage: No Pads: yes Fiber supplement/laxative Yes metamucil chewies-2 /day  URINATION: Pain with urination: No Fully empty bladder: No Stream: Strong Urgency: No Frequency: no Leakage: Sneezing and pressure on abdomen, coughing, lifting something heavy Pads: Yes:    INTERCOURSE: not active   PREGNANCY: no children  PROLAPSE: None   OBJECTIVE:  Note: Objective measures were completed at Evaluation unless otherwise noted.  DIAGNOSTIC FINDINGS:  Balloon manometry   PATIENT SURVEYS:    PFIQ-7: to be be asked- unable to assess today d/t time constraints- pt late to her appt and anxious  COGNITION: Overall cognitive status: Within functional limits for tasks assessed     SENSATION: Light touch: Appears intact  LUMBAR SPECIAL TESTS:  Single leg stance test: Positive bilateral knee  valgus  GAIT: Assistive device utilized: None Comments: antalgic   POSTURE: rounded shoulders, forward head, and increased thoracic kyphosis   LUMBARAROM/PROM: within functional limitations    LOWER EXTREMITY ROM: within functional limitations    LOWER EXTREMITY MMT: 4/5 bilateral hips PALPATION:   General: upper chest breathing strategies, tight and tender large abdominal scar  Pelvic Alignment: even  Abdominal: restricted abdominal scar                External Perineal Exam: to be assessed                             Internal Pelvic Floor: to be assessed   Patient confirms  identification and approves PT to assess internal pelvic floor and treatment No  PELVIC MMT:   MMT eval  Vaginal   Internal Anal Sphincter   External Anal Sphincter   Puborectalis   Diastasis Recti 2 fingers and at below umbilicus, some doming present with curl up  (Blank rows = not tested)        TONE: deferred  PROLAPSE: Deferred to a future date d/t time constraints  TODAY'S TREATMENT:                                                                                                                              DATE: 11/14/2023  EVAL EVAL Examination completed, findings reviewed, pt educated on POC, HEP, and female pelvic floor anatomy. Pt motivated to participate in PT and agreeable to attempt recommendations.     PATIENT EDUCATION:  Education details: Pt was educated on relevant anatomy, exam findings, HEP, expectations of PT   Person educated: Patient Education method: Explanation, Demonstration, Tactile cues, Verbal cues, and Handouts Education comprehension: verbalized understanding, returned demonstration, verbal cues required, tactile cues required, and needs further education  HOME EXERCISE PROGRAM: Access Code: [email or web address] URL: [email or web address] Date: 11/14/2023 Prepared by: Jameson Mcburney Zyire Eidson  Exercises - Diaphragmatic Breathing to Reduce Intra-abdominal Pressure:  Sit to Stand  - 1 x daily - 7 x weekly - 3 sets - 10 reps - Diaphragmatic Breathing to Reduce Intra-abdominal Pressure: Lifting a Basket  - 1 x daily - 7 x weekly - 3 sets - 10 reps - Seated Cough with Pelvic Floor Contraction and Hand to Mouth  - 1 x daily - 7 x weekly - 3 sets - 10 reps  Patient Education - Get To Know Your Pelvic Floor- Female - High-Fiber Diet to Support Pelvic Health - Abdominal Massage for Constipation - Bowel Emptying Techniques  ASSESSMENT:  CLINICAL IMPRESSION: Pt 10 mins late for her eval, She lives 45 mins away.Patient is a 73 y.o. F who was seen today for physical therapy evaluation and treatment for diarrhea. Pt with diastasis rectus, tender and restricted abdominal scar,decreased awareness of her core and pelvic floor, seems anxious and a little  disoriented. She will get a squatty potty and complete complete a 4-5 day bowel diary to identify bowel irritants at this point. She has had chronic diarrhea for 5 years. Pt mentioned stress urinary incontinence and feels like she would like to address that. 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 She will benefit from PT when she returns in August.   OBJECTIVE IMPAIRMENTS: decreased balance, decreased cognition, decreased coordination, decreased endurance, difficulty walking, decreased strength, increased fascial restrictions, improper body mechanics, and pain.   ACTIVITY LIMITATIONS: continence and toileting  PARTICIPATION LIMITATIONS: community activity  PERSONAL FACTORS: Behavior pattern and Time since onset of injury/illness/exacerbation are also affecting patient's functional outcome.   REHAB POTENTIAL: Good  CLINICAL DECISION MAKING: Evolving/moderate  complexity  EVALUATION COMPLEXITY: Moderate   GOALS: Goals reviewed with patient? Yes  SHORT TERM GOALS: Target date:  12/12/2023      Pt will be independent with HEP.    Baseline: Goal status: INITIAL  2.  Pt will be independent with use of squatty potty, relaxed toileting mechanics, and improved bowel movement techniques in order to increase ease of bowel movements and complete evacuation.   Baseline:  Goal status: INITIAL  3.  Pt will be independent with the knack, urge suppression technique, and double voiding in order to improve bladder habits and decrease urinary incontinence.   Baseline:  Goal status: INITIAL   LONG TERM GOALS: Target date: 02/06/2024 ( pt starting in August)  Pt will report her BMs are complete due to improved bowel habits and evacuation techniques.  Baseline:  Goal status: INITIAL  2.  Pt will report no episodes of urinary or fecal incontinence in order to improve confidence in community activities and personal hygiene.   Baseline:  Goal status: INITIAL  3.  Pt will report type 3-4 bristol stool scale consistently Baseline:  Goal status: INITIAL  4.  Pt will be able to have a bowel movement within 2-3 minutes so she is not straining and adding pressure on the pelvic floor due to the ability to lengthen the tissue and generate  pressure to push the stool out  Baseline:  Goal status: INITIAL  5.   Pt will be able to dem 20 squats and 20 jumps without leaking to participate in a regular exercise program  Baseline:  Goal status: INITIAL   PLAN:  PT FREQUENCY: 1-2x/week  PT DURATION: 12 weeks  PLANNED INTERVENTIONS: 97110-Therapeutic exercises, 97530- Therapeutic activity, 97112- Neuromuscular re-education, 97535- Self Care, 64332- Manual therapy, [phone]- Electrical stimulation (manual), [phone] (1-2 muscles), 20561 (3+ muscles)- Dry Needling, Patient/Family education, Taping, Joint mobilization, Joint manipulation, Spinal manipulation, Spinal mobilization, Scar mobilization, Cryotherapy, Moist heat, and Biofeedback  PLAN FOR NEXT SESSION: internal pelvic floor muscle assessment, scar massage, exercises for SUI   Ethel Veronica, PT 11/14/2023, 5:19 PM

## 2023-11-14 ENCOUNTER — Encounter: Payer: Self-pay | Admitting: Physical Therapy

## 2023-11-14 ENCOUNTER — Ambulatory Visit: Attending: Internal Medicine | Admitting: Physical Therapy

## 2023-11-14 ENCOUNTER — Other Ambulatory Visit: Payer: Self-pay

## 2023-11-14 DIAGNOSIS — M6281 Muscle weakness (generalized): Secondary | ICD-10-CM | POA: Diagnosis not present

## 2023-11-14 DIAGNOSIS — K5902 Outlet dysfunction constipation: Secondary | ICD-10-CM | POA: Diagnosis not present

## 2023-11-14 DIAGNOSIS — R278 Other lack of coordination: Secondary | ICD-10-CM | POA: Insufficient documentation

## 2023-11-15 ENCOUNTER — Telehealth: Payer: Self-pay

## 2023-11-15 NOTE — Telephone Encounter (Signed)
Returned the pt's call and vm was full.

## 2023-11-18 DIAGNOSIS — I10 Essential (primary) hypertension: Secondary | ICD-10-CM | POA: Diagnosis not present

## 2023-11-18 DIAGNOSIS — J069 Acute upper respiratory infection, unspecified: Secondary | ICD-10-CM | POA: Diagnosis not present

## 2023-11-18 DIAGNOSIS — H9201 Otalgia, right ear: Secondary | ICD-10-CM | POA: Diagnosis not present

## 2023-11-18 DIAGNOSIS — Z299 Encounter for prophylactic measures, unspecified: Secondary | ICD-10-CM | POA: Diagnosis not present

## 2023-11-18 DIAGNOSIS — J029 Acute pharyngitis, unspecified: Secondary | ICD-10-CM | POA: Diagnosis not present

## 2023-11-19 NOTE — Telephone Encounter (Signed)
 Dr Mordechai April, FYI:  Pt called back stating that her therapist wanted her to have a US . I advised the pt that have her therapist send in a referral because I didn't know what she was speaking of because we had referred her pelvic floor/biofeedback therapy.

## 2023-11-20 ENCOUNTER — Encounter: Payer: Self-pay | Admitting: Podiatry

## 2023-11-20 ENCOUNTER — Ambulatory Visit: Admitting: Podiatry

## 2023-11-20 ENCOUNTER — Other Ambulatory Visit: Payer: Self-pay | Admitting: Obstetrics & Gynecology

## 2023-11-20 DIAGNOSIS — M7662 Achilles tendinitis, left leg: Secondary | ICD-10-CM

## 2023-11-20 DIAGNOSIS — K599 Functional intestinal disorder, unspecified: Secondary | ICD-10-CM

## 2023-11-20 MED ORDER — TRIAMCINOLONE ACETONIDE 10 MG/ML IJ SUSP
10.0000 mg | Freq: Once | INTRAMUSCULAR | Status: AC
  Administered 2023-11-20: 10 mg via INTRA_ARTICULAR

## 2023-11-21 NOTE — Progress Notes (Signed)
 Subjective:   Patient ID: [name], female   DOB: 73 y.o.   MRN: [id number]   HPI Patient states that just in the last couple weeks she has started to develop pain again in her left ankle and states it was doing fine till then but she was very active and probably aggravated   ROS      Objective:  Physical Exam  Vascular status intact with inflammation pain of the medial side of the left Achilles tendon insertion localized with no central or lateral involvement     Assessment:  Reoccurrence of acute Achilles tendinitis left medial side that did excellent for 6 months     Plan:  H&P reviewed she did have activity that caused this that she can be more aware of and I did discuss careful injection and risk and she wants to pursue this.  She understands risk of rupture of the tendon with injection and wants to proceed with that she did so well 6 months ago.  I did careful sterile prep of the area I injected just the medial side stayed away from the center lateral portion of the tendon 3 mg dexamethasone Kenalog  5 mg Xylocaine  and I advised on heel lift treatment and not being as active for the next period of time

## 2023-11-29 ENCOUNTER — Ambulatory Visit
Admission: RE | Admit: 2023-11-29 | Discharge: 2023-11-29 | Disposition: A | Discharge status: Home / Self Care | Source: Ambulatory Visit | Attending: Obstetrics & Gynecology | Admitting: Obstetrics & Gynecology

## 2023-11-29 ENCOUNTER — Encounter: Payer: Self-pay | Admitting: Radiology

## 2023-11-29 DIAGNOSIS — R194 Change in bowel habit: Secondary | ICD-10-CM | POA: Diagnosis not present

## 2023-11-29 DIAGNOSIS — K599 Functional intestinal disorder, unspecified: Secondary | ICD-10-CM | POA: Diagnosis not present

## 2023-11-29 MED ORDER — IOPAMIDOL (ISOVUE-300) INJECTION 61%
100.0000 mL | Freq: Once | INTRAVENOUS | Status: AC | PRN
  Administered 2023-11-29: 100 mL via INTRAVENOUS

## 2023-12-03 DIAGNOSIS — Z6826 Body mass index (BMI) 26.0-26.9, adult: Secondary | ICD-10-CM | POA: Diagnosis not present

## 2023-12-03 DIAGNOSIS — Z299 Encounter for prophylactic measures, unspecified: Secondary | ICD-10-CM | POA: Diagnosis not present

## 2023-12-03 DIAGNOSIS — I10 Essential (primary) hypertension: Secondary | ICD-10-CM | POA: Diagnosis not present

## 2023-12-19 DIAGNOSIS — I10 Essential (primary) hypertension: Secondary | ICD-10-CM | POA: Diagnosis not present

## 2023-12-19 DIAGNOSIS — R52 Pain, unspecified: Secondary | ICD-10-CM | POA: Diagnosis not present

## 2023-12-19 DIAGNOSIS — M25562 Pain in left knee: Secondary | ICD-10-CM | POA: Diagnosis not present

## 2023-12-19 DIAGNOSIS — Z299 Encounter for prophylactic measures, unspecified: Secondary | ICD-10-CM | POA: Diagnosis not present

## 2024-01-07 ENCOUNTER — Ambulatory Visit: Admitting: Physical Therapy

## 2024-01-14 ENCOUNTER — Ambulatory Visit: Attending: Internal Medicine | Admitting: Physical Therapy

## 2024-01-14 ENCOUNTER — Encounter: Payer: Self-pay | Admitting: Physical Therapy

## 2024-01-14 DIAGNOSIS — R278 Other lack of coordination: Secondary | ICD-10-CM | POA: Insufficient documentation

## 2024-01-14 DIAGNOSIS — Z299 Encounter for prophylactic measures, unspecified: Secondary | ICD-10-CM | POA: Diagnosis not present

## 2024-01-14 DIAGNOSIS — M25562 Pain in left knee: Secondary | ICD-10-CM | POA: Diagnosis not present

## 2024-01-14 DIAGNOSIS — I10 Essential (primary) hypertension: Secondary | ICD-10-CM | POA: Diagnosis not present

## 2024-01-14 DIAGNOSIS — M6281 Muscle weakness (generalized): Secondary | ICD-10-CM | POA: Diagnosis not present

## 2024-01-14 NOTE — Patient Instructions (Addendum)
 A high fiber diet with plenty of fluids (up to 8 glasses of water  daily) is suggested to relieve these symptoms.  Metamucil, 1 tablespoon once or twice daily can be used to keep bowels regular if needed.  Fiber Table -- Grams of Fiber in Food  For additional information on fiber content in foods, go to www.caloriecounts.com  Food Products Serving Size Grams of Fiber/serving  Breads    Whole Wheat 1 slice 2.11  White 1 slice 0.5  Rye 1 slice 1.72  Cereals    Oat Bran 1 oz. 4.06  Wheat Bran 1 oz. 10.0  All Bran  cup 6.0  Optimum 1 cup 10.0  Whole Wheat Total 1 cup 3.0  Fiber One   cup 13.0  Shredded Wheat 1oz. 2.64  Corn Flakes 1 oz. 0.45  Cheerio's 1 1/3 cup 2.0  Oatmeal 1 oz. 2.5  Rice    Brown  cup 5.27  White  cup 1.42  Spaghetti 2 oz. 2.56  Vegetables (cooked)    Broccoli  cup 2.58  Brussels sprouts  cup 2.0  Cauliflower  cup 2.6  Carrots  cup 3.2  Corn  cup 3.03  Eggplant  cup 0.96  Green peas  cup 3.36  Lettuce (raw)  cup 0.24  Baked potato w/skin  cup 2.97  Spinach  cup 2.07  Squash  cup 2.87  Tomato (raw)  cup 1.17  Zucchini  cup 1.26  Beans    Green (canned)  cup 1.89  Kidney  cup 5.48  Lima  cup 4.25  Pinto  cup 5.93  Fresh fruits    Apple (with peel) 1 medium 2.76  Apricots 1 cup 3.13  Banana  1 medium 2.19  Black/Boysenberries 1 cup 7.2  Grapefruit 1 medium 3.61  Grapes 1 cup 1.12  Nectarine 1 medium 2.2  Orange 1 medium 3.14  Pear (with peel) 1 medium 4.32  Prunes 3 3.5  Raspberries 1 cup 7.5  Strawberries 1 cup 3.87  Watermelon 1 slice 1.93   Healthy Bowel PT recommendations   Choose the best time of day to have a bowel movement:  Usually the best time of day for a bowel movement will be a half hour to an hour after breakfast.  For some people, a half hour to an hour after lunch will work better.  These times are best because the body uses the gastrocolic reflex, a stimulation of bowel motion that occurs with eating, to  help produce a bowel movement.  Make sure that you are not rushed and have convenient access to a bathroom at your selected time.  Eat all your meals at a predictable time each day.  The bowel functions best when food is introduced at the same regular intervals.  The amount of food eaten at a given time of day should be about the same size from day to day.  The bowel functions best when food is introduced in similar quantities from day to day.  It is fine to have a small breakfast and a large lunch, or vice versa, just be consistent.  Eat two servings of fruit or vegetables and at least one serving of complex carbohydrates (whole grains such as brown rice, bran, whole wheat bread, or oatmeal) at each meal.   A serving of fruit or vegetables is a half-cup or medium-sized piece of fruit.  A serving of a complex carbohydrate is a half-cup or a slice of bread.  It is often desirable to eat more than the  recommended minimum amounts of fruits, vegetables, and complex carbohydrates.  Drink plenty of water - half of your weight in ounces/ day.   Until regular bowel movements are established at a desired time of day, take 2-3 dried prunes (or  to 1/3 cup of prune juice) each night to stimulate morning bowel function.  Exercise daily.  You may exercise at any time of day, but you may find that bowel function is helped most if the exercise is at a consistent time each day.

## 2024-01-14 NOTE — Therapy (Signed)
 OUTPATIENT PHYSICAL THERAPY FEMALE PELVIC EVALUATION   Patient Name: [name] MRN: [id number] DOB:1951-04-24, 73 y.o., female Today's Date: 01/14/2024  END OF SESSION:  PT End of Session - 01/14/24 1620     Visit Number 2    Date for PT Re-Evaluation 02/14/24    Authorization Type [employer] CHOICE PPO    Authorization Time Period [place] APPROVED 8 VISITS Authorization #[id number] 11/14/2023 - 04/15/2024    Authorization - Visit Number 1    Authorization - Number of Visits 8    PT Start Time 1620    PT Stop Time 1706    PT Time Calculation (min) 46 min    Activity Tolerance Patient tolerated treatment well;No increased pain    Behavior During Therapy WFL for tasks assessed/performed          Past Medical History:  Diagnosis Date   Clubbed toes, right    GERD (gastroesophageal reflux disease)    Hypertension    Past Surgical History:  Procedure Laterality Date   ABDOMINAL HYSTERECTOMY     ANAL RECTAL MANOMETRY N/A 10/09/2023   Procedure: MANOMETRY, ANORECTAL;  Surgeon: Shila Gustav GAILS, MD;  Location: [facility];  Service: Gastroenterology;  Laterality: N/A;   BALLOON DILATION N/A 02/26/2020   Procedure: BALLOON DILATION;  Surgeon: Cindie Carlin POUR, DO;  Location: [facility];  Service: Endoscopy;  Laterality: N/A;   BIOPSY  02/26/2020   Procedure: BIOPSY;  Surgeon: Cindie Carlin POUR, DO;  Location: [facility];  Service: Endoscopy;;   BIOPSY  07/29/2020   Procedure: BIOPSY;  Surgeon: Cindie Carlin POUR, DO;  Location: [facility];  Service: Endoscopy;;   COLONOSCOPY WITH PROPOFOL  N/A 02/26/2020   Procedure: COLONOSCOPY WITH PROPOFOL ;  Surgeon: Cindie Carlin POUR, DO;  Location: [facility];  Service: Endoscopy;  Laterality: N/A;  11:00am   ESOPHAGOGASTRODUODENOSCOPY (EGD) WITH PROPOFOL  N/A 02/26/2020   Procedure: ESOPHAGOGASTRODUODENOSCOPY (EGD) WITH PROPOFOL ;  Surgeon: Cindie Carlin POUR, DO;  Location: [facility];  Service: Endoscopy;  Laterality: N/A;    ESOPHAGOGASTRODUODENOSCOPY (EGD) WITH PROPOFOL  N/A 07/29/2020   Procedure: ESOPHAGOGASTRODUODENOSCOPY (EGD) WITH PROPOFOL ;  Surgeon: Cindie Carlin POUR, DO;  Location: [facility];  Service: Endoscopy;  Laterality: N/A;  10:30am   FOOT SURGERY Right    NOSE SURGERY     Patient Active Problem List   Diagnosis Date Noted   Esophagitis, [place] grade A 05/04/2020   Gastritis 05/04/2020   Hiatal hernia 01/14/2020   Dyspepsia 01/14/2020   Colon cancer screening 01/14/2020   HEPATITIS, CHRONIC PERSISTENT 07/17/2010   [place] SPOTTED FEVER 05/15/2010   Essential hypertension 05/15/2010   CHRONIC FATIGUE SYNDROME 05/15/2010   PALPITATIONS, RECURRENT 05/15/2010   HOT FLASHES 03/13/2010   MYALGIA 03/13/2010   PCP: Rosamond Leta NOVAK, MD  REFERRING PROVIDER: Cindie Carlin POUR, DO  REFERRING DIAG: [phone] (ICD-10-CM) - Dyssynergic defecation  THERAPY DIAG:  Other lack of coordination  Muscle weakness (generalized)  Rationale for Evaluation and Treatment: Rehabilitation  ONSET DATE: 2020  SUBJECTIVE:  SUBJECTIVE STATEMENT: Patient returning to PT after 2 months She did a 5 day bowel diary, did not see any rhyme or reason She has 5 bristol stool scale typically She had an ultrasound, they did not see anything She recently noticed that she there is a blockage. Rarely has the urge to have a BM, has had some fecal accident.  Her stool is small- like a finger.  Her gynecologist is involved with this,  She feels like she still has hemorrhoids.  She had a CT scan and they did not see anything She reports that she has knee pain due to a lot of yard works In June she had more normal bowel movements after she took some antibiotics.  She feels like she could have diverticulitis, her sister and her  brother have had it and sister almost died.    Pt reports that she told her doctor that she had restricted bowel before her last colonoscopy and he saw internal inflamed hemorrhoids. She stated that she is lucky if she gets a bowl movement as narrow as her pinky, She gets bloated, feels pressure build up, has to go 3-4 times in the morning, lots of gas. Never feels completely empty.  When he did an exam he saw an evidence of something restricted. Pt could not pass the balloon thing after.  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 She reports that she feels like something else could be going on, she does not like to be a fatalist, but she feels like something is going on. She feels like she needs to have an ultrasound.  Feels like something hormonal is going on.  Pt recently widowed.  She has been eating rice and chicken and has type 4 bristol stool scale  Fluid intake: water , coffee, soda when travelling, milk  PAIN:  Are you having pain? Yes in her heel after a lot of yard work   PRECAUTIONS: None  RED FLAGS: None   WEIGHT BEARING RESTRICTIONS: No  FALLS:  Has patient fallen in last 6 months? No  OCCUPATION: retired, has an Health visitor at [employer]  ACTIVITY LEVEL : house and a lot of yardwork, antique booth  PLOF: Independent  PATIENT GOALS: to have more normal bowel movements like before  PERTINENT HISTORY:  10/09/2023 Anal rectal manometry (N/A)  07/29/2020 Esophagogastroduodenoscopy (egd) with propofol  (N/A)  07/29/2020 Biopsy  02/26/2020 Colonoscopy with propofol  (N/A)  02/26/2020 Esophagogastroduodenoscopy (egd) with propofol  (N/A)  02/26/2020 Balloon dilation (N/A)  02/26/2020 Biopsy  Date Unknown Abdominal hysterectomy Date Unknown Foot surgery (Right) Date Unknown Nose surgery Sexual abuse: No  BOWEL MOVEMENT: has a bowel movement every day Pain with bowel movement: No Type of  bowel movement:Type ([place] Stool Scale) 4-7 Fully empty rectum: No Leakage: No Pads: yes Fiber supplement/laxative Yes metamucil chewies-2 /day  URINATION: Pain with urination: No Fully empty bladder: No Stream: Strong Urgency: No Frequency: no Leakage: Sneezing and pressure on abdomen, coughing, lifting something heavy Pads: Yes:    INTERCOURSE: not active   PREGNANCY: no children  PROLAPSE: None   OBJECTIVE:  Note: Objective measures were completed at Evaluation unless otherwise noted.  DIAGNOSTIC FINDINGS:  Balloon manometry   PATIENT SURVEYS:    PFIQ-7: to be be asked- unable to assess today d/t time constraints- pt late to her appt and anxious  COGNITION: Overall cognitive status: Within functional limits for tasks assessed  SENSATION: Light touch: Appears intact  LUMBAR SPECIAL TESTS:  Single leg stance test: Positive bilateral knee valgus  GAIT: Assistive device utilized: None Comments: antalgic   POSTURE: rounded shoulders, forward head, and increased thoracic kyphosis   LUMBARAROM/PROM: within functional limitations    LOWER EXTREMITY ROM: within functional limitations    LOWER EXTREMITY MMT: 4/5 bilateral hips PALPATION:   General: upper chest breathing strategies, tight and tender large abdominal scar  Pelvic Alignment: even  Abdominal: restricted abdominal scar                External Perineal Exam: dry and decreased estrogen                Internal Pelvic Floor: tight and tender vaginally and rectally  Patient confirms identification and approves PT to assess internal pelvic floor and treatment yes Patient confirms identification and approves PT to assess internal pelvic floor and treatment Yes No emotional/communication barriers or cognitive limitation. Patient is motivated to learn. Patient understands and agrees with treatment goals and plan. PT explains patient will be examined in standing, sitting, and lying down to see how  their muscles and joints work. When they are ready, they will be asked to remove their underwear so PT can examine their perineum. The patient is also given the option of providing their own chaperone as one is not provided in our facility. The patient also has the right and is explained the right to defer or refuse any part of the evaluation or treatment including the internal exam. With the patient's consent, PT will use one gloved finger to gently assess the muscles of the pelvic floor, seeing how well it contracts and relaxes and if there is muscle symmetry. After, the patient will get dressed and PT and patient will discuss exam findings and plan of care. PT and patient discuss plan of care, schedule, attendance policy and HEP activities.      PELVIC MMT:   MMT eval  Vaginal 4/5  Internal Anal Sphincter 5/5  External Anal Sphincter 5/5  Puborectalis   Diastasis Recti 2 fingers and at below umbilicus, some doming present with curl up  (Blank rows = not tested)        TONE: high  PROLAPSE: Unable to assess due to tenderness   TODAY'S TREATMENT:                                                                                                                              DATE: 01/14/2024 Nu step 15 mins with therapist present to discuss progress Bowel diary review Education on healthy bowel habits and fiber Internal pelvic floor muscle assessment      11/14/2023  EVAL Examination completed, findings reviewed, pt educated on POC, HEP, and female pelvic floor anatomy. Pt motivated to participate in PT and agreeable to attempt recommendations.     PATIENT EDUCATION:  Education details: Pt was educated on relevant anatomy, exam findings, HEP, expectations  of PT   Person educated: Patient Education method: Explanation, Demonstration, Tactile cues, Verbal cues, and Handouts Education comprehension: verbalized understanding, returned demonstration, verbal cues required, tactile cues  required, and needs further education  HOME EXERCISE PROGRAM: Access Code: [email or web address] URL: [email or web address] Date: 11/14/2023 Prepared by: Cori Linette Gunderson  Exercises - Diaphragmatic Breathing to Reduce Intra-abdominal Pressure: Sit to Stand  - 1 x daily - 7 x weekly - 3 sets - 10 reps - Diaphragmatic Breathing to Reduce Intra-abdominal Pressure: Lifting a Basket  - 1 x daily - 7 x weekly - 3 sets - 10 reps - Seated Cough with Pelvic Floor Contraction and Hand to Mouth  - 1 x daily - 7 x weekly - 3 sets - 10 reps  Patient Education - Get To Know Your Pelvic Floor- Female - High-Fiber Diet to Support Pelvic Health - Abdominal Massage for Constipation - Bowel Emptying Techniques  ASSESSMENT:  CLINICAL IMPRESSION: Patient was seen today for treatment of diarrhea. Patient with high tone pelvic floor vaginally and rectally. Patient did fairly well with manual therapy focusing on gentle rectal release  and education today. We discussed progress, fiber, bowel diary and identified that patient had type 4 bristol stool scale with more cheese in her diet. Treatment session focused on education and review of bowel diary to improve diarrhea. Patient had some difficulty with pelvic floor lengthening, but fair coordination. Patient is progressing slowly towards goals and will benefit from continued PT to address deficits, reduce gas and diarrhea and improve quality of life.      From eval Pt 10 mins late for her eval, She lives 45 mins away.Patient is a 73 y.o. F who was seen today for physical therapy evaluation and treatment for diarrhea. Pt with diastasis rectus, tender and restricted abdominal scar,decreased awareness of her core and pelvic floor, seems anxious and a little  disoriented. She will get a squatty potty and complete complete a 4-5 day bowel diary to identify bowel irritants at this point. She has had chronic diarrhea for 5 years. Pt mentioned stress urinary incontinence and  feels like she would like to address that. 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 She will benefit from PT when she returns in August.   OBJECTIVE IMPAIRMENTS: decreased balance, decreased cognition, decreased coordination, decreased endurance, difficulty walking, decreased strength, increased fascial restrictions, improper body mechanics, and pain.   ACTIVITY LIMITATIONS: continence and toileting  PARTICIPATION LIMITATIONS: community activity  PERSONAL FACTORS: Behavior pattern and Time since onset of injury/illness/exacerbation are also affecting patient's functional outcome.   REHAB POTENTIAL: Good  CLINICAL DECISION MAKING: Evolving/moderate complexity  EVALUATION COMPLEXITY: Moderate   GOALS: Goals reviewed with patient? Yes  SHORT TERM GOALS: Target date:  12/12/2023      Pt will be independent with HEP.   Baseline: Goal status: INITIAL  2.  Pt will be independent with use of squatty potty, relaxed toileting mechanics, and improved bowel movement techniques in order to increase ease of bowel movements and complete evacuation.   Baseline:  Goal status: INITIAL  3.  Pt will be independent with the knack, urge suppression technique, and double voiding in order to improve bladder habits and decrease urinary incontinence.   Baseline:  Goal status: INITIAL   LONG TERM GOALS: Target date: 02/06/2024 ( pt starting in August)  Pt will report her BMs are complete due to improved bowel habits and evacuation techniques.  Baseline:  Goal status: INITIAL  2.  Pt will report no episodes of urinary or fecal incontinence in order to improve confidence in community activities and personal hygiene.   Baseline:  Goal status: INITIAL  3.  Pt will report type 3-4 bristol stool scale consistently Baseline:  Goal status: INITIAL  4.  Pt will be able to have a bowel movement within 2-3 minutes so she is not straining  and adding pressure on the pelvic floor due to the ability to lengthen the tissue and generate  pressure to push the stool out  Baseline:  Goal status: INITIAL  5.   Pt will be able to dem 20 squats and 20 jumps without leaking to participate in a regular exercise program  Baseline:  Goal status: INITIAL   PLAN:  PT FREQUENCY: 1-2x/week  PT DURATION: 12 weeks  PLANNED INTERVENTIONS: 97110-Therapeutic exercises, 97530- Therapeutic activity, 97112- Neuromuscular re-education, 97535- Self Care, [id number]- Manual therapy, [phone]- Electrical stimulation (manual), [phone] (1-2 muscles), [phone] (3+ muscles)- Dry Needling, Patient/Family education, Taping, Joint mobilization, Joint manipulation, Spinal manipulation, Spinal mobilization, Scar mobilization, Cryotherapy, Moist heat, and Biofeedback  PLAN FOR NEXT SESSION: internal pelvic floor muscle assessment, scar massage, exercises for SUI   Kataleah Bejar, PT 01/14/2024, 5:07 PM

## 2024-01-21 ENCOUNTER — Ambulatory Visit: Admitting: Physical Therapy

## 2024-01-23 DIAGNOSIS — M25562 Pain in left knee: Secondary | ICD-10-CM | POA: Diagnosis not present

## 2024-01-23 DIAGNOSIS — I10 Essential (primary) hypertension: Secondary | ICD-10-CM | POA: Diagnosis not present

## 2024-01-23 DIAGNOSIS — K5902 Outlet dysfunction constipation: Secondary | ICD-10-CM

## 2024-01-23 DIAGNOSIS — Z299 Encounter for prophylactic measures, unspecified: Secondary | ICD-10-CM | POA: Diagnosis not present

## 2024-01-28 ENCOUNTER — Ambulatory Visit: Admitting: Physical Therapy

## 2024-01-28 ENCOUNTER — Encounter: Payer: Self-pay | Admitting: Physical Therapy

## 2024-01-28 DIAGNOSIS — M6281 Muscle weakness (generalized): Secondary | ICD-10-CM | POA: Diagnosis not present

## 2024-01-28 DIAGNOSIS — M25562 Pain in left knee: Secondary | ICD-10-CM | POA: Diagnosis not present

## 2024-01-28 DIAGNOSIS — R278 Other lack of coordination: Secondary | ICD-10-CM

## 2024-01-28 NOTE — Therapy (Signed)
 OUTPATIENT PHYSICAL THERAPY FEMALE PELVIC EVALUATION   Patient Name: Jeannifer Drakeford MRN: [id number] DOB:11/27/1950, 73 y.o., female Today's Date: 01/28/2024  END OF SESSION:  PT End of Session - 01/28/24 1606     Visit Number 3    Date for PT Re-Evaluation 02/14/24    Authorization Type [employer]IC[employer] CHOICE PPO    Authorization Time Period 11/14/2023 - 04/15/2024    Authorization - Visit Number 2    Authorization - Number of Visits 8    Progress Note Due on Visit 10    PT Start Time 1605    PT Stop Time 1658    PT Time Calculation (min) 53 min    Activity Tolerance Patient tolerated treatment well;No increased pain    Behavior During Therapy WFL for tasks assessed/performed          Past Medical History:  Diagnosis Date   Clubbed toes, right    GERD (gastroesophageal reflux disease)    Hypertension    Past Surgical History:  Procedure Laterality Date   ABDOMINAL HYSTERECTOMY     ANAL RECTAL MANOMETRY N/A 10/09/2023   Procedure: MANOMETRY, ANORECTAL;  Surgeon: Shila Gustav GAILS, MD;  Location: [facility];  Service: Gastroenterology;  Laterality: N/A;   BALLOON DILATION N/A 02/26/2020   Procedure: BALLOON DILATION;  Surgeon: Cindie Carlin POUR, DO;  Location: [facility];  Service: Endoscopy;  Laterality: N/A;   BIOPSY  02/26/2020   Procedure: BIOPSY;  Surgeon: Cindie Carlin POUR, DO;  Location: [facility];  Service: Endoscopy;;   BIOPSY  07/29/2020   Procedure: BIOPSY;  Surgeon: Cindie Carlin POUR, DO;  Location: [facility];  Service: Endoscopy;;   COLONOSCOPY WITH PROPOFOL  N/A 02/26/2020   Procedure: COLONOSCOPY WITH PROPOFOL ;  Surgeon: Cindie Carlin POUR, DO;  Location: [facility];  Service: Endoscopy;  Laterality: N/A;  11:00am   ESOPHAGOGASTRODUODENOSCOPY (EGD) WITH PROPOFOL  N/A 02/26/2020   Procedure: ESOPHAGOGASTRODUODENOSCOPY (EGD) WITH PROPOFOL ;  Surgeon: Cindie Carlin POUR, DO;  Location: [facility];  Service: Endoscopy;  Laterality: N/A;    ESOPHAGOGASTRODUODENOSCOPY (EGD) WITH PROPOFOL  N/A 07/29/2020   Procedure: ESOPHAGOGASTRODUODENOSCOPY (EGD) WITH PROPOFOL ;  Surgeon: Cindie Carlin POUR, DO;  Location: [facility];  Service: Endoscopy;  Laterality: N/A;  10:30am   FOOT SURGERY Right    NOSE SURGERY     Patient Active Problem List   Diagnosis Date Noted   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 01/23/2024   Dyssynergic defecation 01/23/2024   Esophagitis, [place] grade A 05/04/2020   Gastritis 05/04/2020   Hiatal hernia 01/14/2020   Dyspepsia 01/14/2020   Colon cancer screening 01/14/2020   HEPATITIS, CHRONIC PERSISTENT 07/17/2010   [place] SPOTTED FEVER 05/15/2010   Essential hypertension 05/15/2010   CHRONIC FATIGUE SYNDROME 05/15/2010   PALPITATIONS, RECURRENT 05/15/2010   HOT FLASHES 03/13/2010   MYALGIA 03/13/2010   PCP: Rosamond Leta NOVAK, MD  REFERRING PROVIDER: Cindie Carlin POUR, DO  REFERRING DIAG: [phone] (ICD-10-CM) - Dyssynergic defecation  THERAPY DIAG:  Other lack of coordination  Muscle weakness (generalized)  Rationale for Evaluation and Treatment: Rehabilitation  ONSET DATE: 2020  SUBJECTIVE:  SUBJECTIVE STATEMENT: Patient reports that she has been tracking fiber, she eats between 27-30 g/ grams She reports that she eats a lot of vitamins, has been a health nut since high school Always has had 2-3 bowel movements in the mornings- since high school. Had to get up 2 hours earlier to empty.  Now bowel movements 2-3/ day in the morning. Bristol stool scale 4-6.  She has had hard time with exercises due to knee pain, they suspect a torn meniscus Patient reports that her gassiness bothers her the most.    Last session Patient returning to PT after 2 months She did a 5 day bowel diary, did not see any  rhyme or reason She has 5 bristol stool scale typically She had an ultrasound, they did not see anything She recently noticed that she there is a blockage. Rarely has the urge to have a BM, has had some fecal accidents.  Her stool is small- like a finger.  Her gynecologist is involved with this,  She feels like she still has hemorrhoids.  She had a CT scan and they did not see anything She reports that she has knee pain due to a lot of yard work. In June she had more normal bowel movements after she took some antibiotics.  She feels like she could have diverticulitis, her sister and her brother have had it and sister almost died.   From eval: Pt reports that she told her doctor that she had restricted bowel before her last colonoscopy and he saw internal inflamed hemorrhoids. She stated that she is lucky if she gets a bowel movement as narrow as her pinky, She gets bloated, feels pressure build up, has to go 3-4 times in the morning, lots of gas. Never feels completely empty.  When he did an exam he saw an evidence of something restricted. Pt could not pass the balloon thing after.  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 She reports that she feels like something else could be going on, she does not like to be a fatalist, but she feels like something is going on. She feels like she needs to have an ultrasound.  Feels like something hormonal is going on.  Pt recently widowed.  She has been eating rice and chicken and has type 4 bristol stool scale  Fluid intake: water , coffee, soda when travelling, milk  PAIN:  Are you having pain? Yes in her heel after a lot of yard work   PRECAUTIONS: None  RED FLAGS: None   WEIGHT BEARING RESTRICTIONS: No  FALLS:  Has patient fallen in last 6 months? No  OCCUPATION: retired, has an Health visitor at [employer]  ACTIVITY LEVEL : house and a lot of yardwork, antique  booth  PLOF: Independent  PATIENT GOALS: to have more normal bowel movements like before  PERTINENT HISTORY: see above   Sexual abuse: No  BOWEL MOVEMENT: has a bowel movement every day Pain with bowel movement: No Type of bowel movement:Type (Bristol Stool Scale) 4-7 Fully empty rectum: No Leakage: No Pads: yes Fiber supplement/laxative Yes metamucil chewies-2 /day  URINATION: Pain with urination: No Fully empty bladder: No Stream: Strong Urgency: No Frequency: no Leakage: Sneezing and pressure on abdomen, coughing, lifting something heavy Pads: Yes:    INTERCOURSE: not active   PREGNANCY: no children  PROLAPSE: None   OBJECTIVE:  Note: Objective measures were completed at  Evaluation unless otherwise noted.  DIAGNOSTIC FINDINGS:  Balloon manometry   PATIENT SURVEYS:    PFIQ-7: 10  COGNITION: Overall cognitive status: Within functional limits for tasks assessed     SENSATION: Light touch: Appears intact  LUMBAR SPECIAL TESTS:  Single leg stance test: Positive bilateral knee valgus  GAIT: Assistive device utilized: None Comments: antalgic   POSTURE: rounded shoulders, forward head, and increased thoracic kyphosis   LUMBARAROM/PROM: within functional limitations    LOWER EXTREMITY ROM: within functional limitations    LOWER EXTREMITY MMT: 4/5 bilateral hips PALPATION:   General: upper chest breathing strategies, tight and tender large abdominal scar  Pelvic Alignment: even  Abdominal: restricted abdominal scar                External Perineal Exam: dry and decreased estrogen                Internal Pelvic Floor: tight and tender vaginally and rectally  Patient confirms identification and approves PT to assess internal pelvic floor and treatment yes Patient confirms identification and approves PT to assess internal pelvic floor and treatment Yes No emotional/communication barriers or cognitive limitation. Patient is motivated to learn.  Patient understands and agrees with treatment goals and plan. PT explains patient will be examined in standing, sitting, and lying down to see how their muscles and joints work. When they are ready, they will be asked to remove their underwear so PT can examine their perineum. The patient is also given the option of providing their own chaperone as one is not provided in our facility. The patient also has the right and is explained the right to defer or refuse any part of the evaluation or treatment including the internal exam. With the patient's consent, PT will use one gloved finger to gently assess the muscles of the pelvic floor, seeing how well it contracts and relaxes and if there is muscle symmetry. After, the patient will get dressed and PT and patient will discuss exam findings and plan of care. PT and patient discuss plan of care, schedule, attendance policy and HEP activities.      PELVIC MMT:   MMT eval  Vaginal 4/5  Internal Anal Sphincter 5/5  External Anal Sphincter 5/5  Puborectalis   Diastasis Recti 2 fingers and at below umbilicus, some doming present with curl up  (Blank rows = not tested)        TONE: high  PROLAPSE: Unable to assess due to tenderness   TODAY'S TREATMENT:                                                                                                                              DATE: 01/28/2024 Manual - abdominal scar massage performed and PT educated on Diaphragmatic breathing 6 breaths HS with strap 2 x30 secs Single knee to chest 2x30s right only due to left knee pain Yoga ball perineal massage seated Deep breathing reed with thera band  around lower ribs on yoga ball with pelvic floor drop Education on relevant anatomy, down training, rationale for down training, new HEP provided    01/14/2024 Nu step 15 mins with therapist present to discuss progress Bowel diary review Education on healthy bowel habits and fiber Internal pelvic floor muscle  assessment      11/14/2023  EVAL Examination completed, findings reviewed, pt educated on POC, HEP, and female pelvic floor anatomy. Pt motivated to participate in PT and agreeable to attempt recommendations.     PATIENT EDUCATION:  Education details: Pt was educated on relevant anatomy, exam findings, HEP, expectations of PT   Person educated: Patient Education method: Explanation, Demonstration, Tactile cues, Verbal cues, and Handouts Education comprehension: verbalized understanding, returned demonstration, verbal cues required, tactile cues required, and needs further education  HOME EXERCISE PROGRAM: Access Code: [email or web address] URL: [email or web address] Date: 11/14/2023 Prepared by: Cori Tomoko Sandra  Exercises - Diaphragmatic Breathing to Reduce Intra-abdominal Pressure: Sit to Stand  - 1 x daily - 7 x weekly - 3 sets - 10 reps - Diaphragmatic Breathing to Reduce Intra-abdominal Pressure: Lifting a Basket  - 1 x daily - 7 x weekly - 3 sets - 10 reps - Seated Cough with Pelvic Floor Contraction and Hand to Mouth  - 1 x daily - 7 x weekly - 3 sets - 10 reps  Patient Education - Get To Know Your Pelvic Floor- Female - High-Fiber Diet to Support Pelvic Health - Abdominal Massage for Constipation - Bowel Emptying Techniques  ASSESSMENT:  CLINICAL IMPRESSION: Patient was seen today for treatment of diarrhea and restricted bowel movements. Patient with high tone pelvic floor vaginally and rectally. Tx focused on pelvic floor lengthening, down training, diaphragmatic breathing and manual abdominal massage as well as scar massage today. Restricted bowel movements have been chronic since 2019. Patient has bristol stool scale type 4-6 typically now with gas. Patient with significant left knee pain which is limiting her bending left knee and some exercises. Patient with high tome pelvic floor, upper chest breathing strategies, decreased mobility of lateral ribs with breathing and  tight and restricted abdominal scar. She seems frustrated and anxious today. Required repeated multimodal clues for diaphragmatic breathing and pelvic floor lengthening.  Patient is progressing slowly towards goals and will benefit from continued PT to address deficits, reduce gas and diarrhea and improve quality of life.      From eval Pt 10 mins late for her eval, She lives 45 mins away.Patient is a 73 y.o. F who was seen today for physical therapy evaluation and treatment for diarrhea. Pt with diastasis rectus, tender and restricted abdominal scar,decreased awareness of her core and pelvic floor, seems anxious and a little  disoriented. She will get a squatty potty and complete complete a 4-5 day bowel diary to identify bowel irritants at this point. She has had chronic diarrhea for 5 years. Pt mentioned stress urinary incontinence and feels like she would like to address that. 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 She will benefit from PT when she returns in August.   OBJECTIVE IMPAIRMENTS: decreased balance, decreased cognition, decreased coordination, decreased endurance, difficulty walking, decreased strength, increased fascial restrictions, improper body mechanics, and pain.   ACTIVITY LIMITATIONS: continence and toileting  PARTICIPATION LIMITATIONS: community activity  PERSONAL FACTORS: Behavior pattern and Time since onset of injury/illness/exacerbation are also affecting patient's functional outcome.   REHAB POTENTIAL: Good  CLINICAL DECISION MAKING:  Evolving/moderate complexity  EVALUATION COMPLEXITY: Moderate   GOALS: Goals reviewed with patient? Yes  SHORT TERM GOALS: Target date:  12/12/2023      Pt will be independent with HEP.   Baseline: Goal status: ongoing  2.  Pt will be independent with use of squatty potty, relaxed toileting mechanics, and improved bowel movement techniques in order to  increase ease of bowel movements and complete evacuation.   Baseline:  Goal status: met  3.  Pt will be independent with the knack, urge suppression technique, and double voiding in order to improve bladder habits and decrease urinary incontinence.   Baseline:  Goal status: INITIAL   LONG TERM GOALS: Target date: 02/06/2024 ( pt starting in August)  Pt will report her BMs are complete due to improved bowel habits and evacuation techniques.  Baseline:  Goal status: INITIAL  2.  Pt will report no episodes of urinary or fecal incontinence in order to improve confidence in community activities and personal hygiene.   Baseline:  Goal status: INITIAL  3.  Pt will report type 3-4 bristol stool scale consistently Baseline:  Goal status: INITIAL  4.  Pt will be able to have a bowel movement within 2-3 minutes so she is not straining and adding pressure on the pelvic floor due to the ability to lengthen the tissue and generate  pressure to push the stool out  Baseline:  Goal status: INITIAL  5.   Pt will be able to dem 20 squats and 20 jumps without leaking to participate in a regular exercise program  Baseline:  Goal status: INITIAL   PLAN:  PT FREQUENCY: 1-2x/week  PT DURATION: 12 weeks  PLANNED INTERVENTIONS: 97110-Therapeutic exercises, 97530- Therapeutic activity, 97112- Neuromuscular re-education, 97535- Self Care, 02859- Manual therapy, [phone]- Electrical stimulation (manual), [phone] (1-2 muscles), [phone] (3+ muscles)- Dry Needling, Patient/Family education, Taping, Joint mobilization, Joint manipulation, Spinal manipulation, Spinal mobilization, Scar mobilization, Cryotherapy, Moist heat, and Biofeedback  PLAN FOR NEXT SESSION: abdominal massage, scar massage, downtraining, rectal manual work Potentially ortho for left knee pain   Costantino Kohlbeck, PT 01/28/2024, 4:58 PM

## 2024-01-29 ENCOUNTER — Other Ambulatory Visit: Payer: Self-pay | Admitting: Orthopedic Surgery

## 2024-01-29 DIAGNOSIS — M25562 Pain in left knee: Secondary | ICD-10-CM

## 2024-02-01 ENCOUNTER — Ambulatory Visit
Admission: RE | Admit: 2024-02-01 | Discharge: 2024-02-01 | Disposition: A | Discharge status: Home / Self Care | Source: Ambulatory Visit | Attending: Orthopedic Surgery | Admitting: Orthopedic Surgery

## 2024-02-01 DIAGNOSIS — M25562 Pain in left knee: Secondary | ICD-10-CM | POA: Diagnosis not present

## 2024-02-01 DIAGNOSIS — M25462 Effusion, left knee: Secondary | ICD-10-CM | POA: Diagnosis not present

## 2024-02-07 ENCOUNTER — Telehealth: Payer: Self-pay

## 2024-02-07 NOTE — Telephone Encounter (Signed)
 Pt called and LMOVM regarding us  re-scheduling her PT or helping her to do it so she don't loose it. Pt stated she has hurt her knee and cannot do the exercises. Checked with Mindy and was advised by her that the pt needs to call and reschedule. Will advise the pt of this

## 2024-02-18 ENCOUNTER — Encounter: Admitting: Physical Therapy

## 2024-02-25 ENCOUNTER — Ambulatory Visit: Admitting: Physical Therapy

## 2024-03-04 DIAGNOSIS — Z299 Encounter for prophylactic measures, unspecified: Secondary | ICD-10-CM | POA: Diagnosis not present

## 2024-03-04 DIAGNOSIS — R531 Weakness: Secondary | ICD-10-CM | POA: Diagnosis not present

## 2024-03-04 DIAGNOSIS — I10 Essential (primary) hypertension: Secondary | ICD-10-CM | POA: Diagnosis not present

## 2024-03-17 ENCOUNTER — Encounter: Payer: Self-pay | Admitting: Gastroenterology

## 2024-03-17 ENCOUNTER — Ambulatory Visit: Admitting: Gastroenterology

## 2024-03-17 VITALS — BP 124/71 | HR 76 | Temp 98.0°F | Ht 65.0 in | Wt 156.2 lb

## 2024-03-17 DIAGNOSIS — K5902 Outlet dysfunction constipation: Secondary | ICD-10-CM | POA: Diagnosis not present

## 2024-03-17 NOTE — Progress Notes (Signed)
 GI Office Note    Referring Provider: Rosamond Leta NOVAK, MD Primary Care Physician:  Rosamond Leta NOVAK, MD  Primary Gastroenterologist: Carlin POUR. Cindie, DO   Chief Complaint   Chief Complaint  Patient presents with   Diarrhea    Issues with bowels     History of Present Illness   [name] is a 73 y.o. female presenting today for follow up. She was last seen in 04/2023 by Dr. Cindie. History of GERD and issues with defecation.   At last ov, she reported feeling like she had restriction in her rectum. Reported skinny ropelike stools often. Incomplete evacuation. On DRE, anal sphincter felt tight, no masses.   Anorectal manometry was completed 10/2023. She had normal internal and external anal sphincter pressures. She had evidence of dyssynergic defecation. Rectal hyposensitivity noted. She was unable to expel rectal balloon.   She was referred for pelvic floor physical therapy for biofeedback. -Initial appt 11/14/23: she was 10 minutes late, therapy session shortened. -Visit two 01/14/24: vaginal/rectal exam noted high tone pelvic floor -Visit three 01/28/24:  Discussed the use of AI scribe software for clinical note transcription with the patient, who gave verbal consent to proceed.  She presents with bowel irregularities and physical therapy concerns.  She experiences ongoing bowel irregularities with restricted bowel movements and occasional diarrhea. Her bowel movements begin with small, narrow stools (Bristol 4) and progress to looser stools (Bristol 7) by the third attempt each morning. This pattern has persisted since before her last colonoscopy in September 2021. Her stools became more regular after taking amoxicillin in June 2025.   She encountered difficulties with physical therapy for rectal issues due to a knee injury sustained in late June or early July 2025. The exercises exacerbated her knee pain, leading her to cancel further appointments. She has requested a  different therapist, she didn't feel like it was a good fit with her current therapist. She has upcoming appointment in 05/2024 with new therapist.     She charting dietary intake of fiber and is consuming 25-33g per day  Reflux well controlled.  Prior Data   CT A/P with contrast 11/2023: negative  Anal Manometry 10/2023: She had normal internal and external anal sphincter pressures. She had evidence of dyssynergic defecation. Rectal hyposensitivity noted. She was unable to expel rectal balloon.   EGD 07/29/2020 to evaluate healing as well as to biopsy for Barrett's esophagus. Esophagitis had completely healed. Biopsies showed mild inflammation, no intestinal metaplasia.   Colonoscopy 02/26/20 unremarkable with a 10-year recall.   Medications   Current Outpatient Medications  Medication Sig Dispense Refill   Bioflavonoid Products (ESTER C PO) Take 500 mg by mouth daily.     Biotin 1000 MCG tablet Take 1,000 mcg by mouth daily.      Calcium Carb-Cholecalciferol (CALCIUM 600+D) 600-800 MG-UNIT TABS Take 1 tablet by mouth daily.     Cholecalciferol (VITAMIN D) 50 MCG (2000 UT) tablet Take by mouth daily. 3,000 Nature made     magnesium 30 MG tablet Take 30 mg by mouth daily at 6 (six) AM.     Multiple Vitamins-Minerals (PRESERVISION AREDS 2) CAPS Take 1 capsule by mouth every other day.     OVER THE COUNTER MEDICATION CQ 10 daily     pantoprazole  (PROTONIX ) 20 MG tablet Take 1 tablet (20 mg total) by mouth daily. 90 tablet 3   Prenatal Vit-Fe Fumarate-FA (PRENATAL PO) Take 1 tablet by mouth daily as needed (when not taking  the other supplements).      SUPER B COMPLEX/C PO Take 1 tablet by mouth daily.     valsartan (DIOVAN) 160 MG tablet Take 160 mg by mouth daily.     vitamin E 180 MG (400 UNITS) capsule Take 400 Units by mouth daily.     zinc gluconate 50 MG tablet Take 50 mg by mouth daily.      No current facility-administered medications for this visit.    Allergies   Allergies  as of 03/17/2024 - Review Complete 03/17/2024  Allergen Reaction Noted   Ibuprofen  03/13/2010   Nsaids  01/31/2018      Review of Systems   General: Negative for anorexia, weight loss, fever, chills, fatigue, weakness. ENT: Negative for hoarseness, difficulty swallowing , nasal congestion. CV: Negative for chest pain, angina, palpitations, dyspnea on exertion, peripheral edema.  Respiratory: Negative for dyspnea at rest, dyspnea on exertion, cough, sputum, wheezing.  GI: See history of present illness. GU:  Negative for dysuria, hematuria, urinary incontinence, urinary frequency, nocturnal urination.  Endo: Negative for unusual weight change.     Physical Exam   BP 135/74 (BP Location: Right Arm, Patient Position: Sitting, Cuff Size: Normal)   Pulse 76   Temp 98 F (36.7 C) (Temporal)   Ht 5' 5 (1.651 m)   Wt 156 lb 3.2 oz (70.9 kg)   BMI 25.99 kg/m    General: Well-nourished, well-developed in no acute distress.  Eyes: No icterus. Abdomen: Bowel sounds are normal, nontender, nondistended, no hepatosplenomegaly or masses,  no abdominal bruits or hernia , no rebound or guarding.  Rectal: not performed Extremities: No lower extremity edema. No clubbing or deformities. Neuro: Alert and oriented x 4   Skin: Warm and dry, no jaundice.   Psych: Alert and cooperative, normal mood and affect.  Labs   None available  Imaging Studies   No results found.  Assessment/Plan:   Dyssynergic defecation: -diagnosed by anorectal manometry in May 2025 -symptoms present for at least 5 years, present at time of colonoscopy in 2021 -CT A/P with contrast June 2025 unremarkable -referred for pelvic floor therapy/biofeedback therapy -Initial PT evaluation (11/2023)  -missed 2nd appt, arrived too late and had to reschedule -next appts (01/14/24, 01/28/24) -cancelled appts with first therapists for Sept/Oct/Nov -next appt 05/04/24 with new therapist   -discussed with patient, biofeedback  therapy is gold standard for dyssynergic defecation with 70-80% success rates compared to 20-30% for laxatives. She is in a mult-week program that takes time to work through all modalities that will be used to help her progress. She needs to be consistent with appointments and exercises. Her knee pain has prevented her to complete some of her exercises but others she should be able to complete to help with pelvic floor relaxation. -due to delay in next PT appt, will add miralax 1/2 capful to 1 capful of miralax to help with passage of stool.   -return ov in 3 months.   Sonny RAMAN. Ezzard, MHS, PA-C [facility]

## 2024-03-17 NOTE — Patient Instructions (Addendum)
 Please start miralax 1/2 capful daily to soften up stool to see if this allows for easier passage of stool. If needed, you can work up to 1 capful daily. Continue with physical therapy for dyssynergic defecation, biofeedback therapy.  Return to the office in three months.

## 2024-03-18 ENCOUNTER — Ambulatory Visit: Admitting: Physical Therapy

## 2024-03-18 DIAGNOSIS — M25562 Pain in left knee: Secondary | ICD-10-CM | POA: Diagnosis not present

## 2024-03-18 DIAGNOSIS — G8929 Other chronic pain: Secondary | ICD-10-CM | POA: Diagnosis not present

## 2024-03-24 ENCOUNTER — Encounter: Payer: Self-pay | Admitting: Internal Medicine

## 2024-03-26 ENCOUNTER — Encounter: Admitting: Physical Therapy

## 2024-04-02 ENCOUNTER — Encounter: Admitting: Physical Therapy

## 2024-04-02 DIAGNOSIS — G8929 Other chronic pain: Secondary | ICD-10-CM | POA: Diagnosis not present

## 2024-04-02 DIAGNOSIS — M25562 Pain in left knee: Secondary | ICD-10-CM | POA: Diagnosis not present

## 2024-04-06 DIAGNOSIS — G8929 Other chronic pain: Secondary | ICD-10-CM | POA: Diagnosis not present

## 2024-04-06 DIAGNOSIS — M25562 Pain in left knee: Secondary | ICD-10-CM | POA: Diagnosis not present

## 2024-04-08 DIAGNOSIS — G8929 Other chronic pain: Secondary | ICD-10-CM | POA: Diagnosis not present

## 2024-04-08 DIAGNOSIS — M25562 Pain in left knee: Secondary | ICD-10-CM | POA: Diagnosis not present

## 2024-04-09 ENCOUNTER — Encounter: Admitting: Physical Therapy

## 2024-04-13 DIAGNOSIS — G8929 Other chronic pain: Secondary | ICD-10-CM | POA: Diagnosis not present

## 2024-04-13 DIAGNOSIS — M25562 Pain in left knee: Secondary | ICD-10-CM | POA: Diagnosis not present

## 2024-04-16 ENCOUNTER — Encounter: Admitting: Physical Therapy

## 2024-04-16 DIAGNOSIS — G8929 Other chronic pain: Secondary | ICD-10-CM | POA: Diagnosis not present

## 2024-04-16 DIAGNOSIS — M25562 Pain in left knee: Secondary | ICD-10-CM | POA: Diagnosis not present

## 2024-04-24 DIAGNOSIS — G8929 Other chronic pain: Secondary | ICD-10-CM | POA: Diagnosis not present

## 2024-04-24 DIAGNOSIS — M25562 Pain in left knee: Secondary | ICD-10-CM | POA: Diagnosis not present

## 2024-04-25 ENCOUNTER — Other Ambulatory Visit: Payer: Self-pay | Admitting: Internal Medicine

## 2024-04-28 DIAGNOSIS — M25562 Pain in left knee: Secondary | ICD-10-CM | POA: Diagnosis not present

## 2024-04-28 DIAGNOSIS — G8929 Other chronic pain: Secondary | ICD-10-CM | POA: Diagnosis not present

## 2024-05-04 ENCOUNTER — Ambulatory Visit

## 2024-05-04 DIAGNOSIS — M6281 Muscle weakness (generalized): Secondary | ICD-10-CM | POA: Diagnosis present

## 2024-05-04 DIAGNOSIS — R278 Other lack of coordination: Secondary | ICD-10-CM | POA: Insufficient documentation

## 2024-05-04 DIAGNOSIS — M62838 Other muscle spasm: Secondary | ICD-10-CM | POA: Diagnosis present

## 2024-05-04 NOTE — Therapy (Signed)
 OUTPATIENT PHYSICAL THERAPY FEMALE PELVIC TREATMENT   Patient Name: [name] MRN: [id number] DOB:14-Apr-1951, 73 y.o., female Today's Date: 05/04/2024  END OF SESSION:  PT End of Session - 05/04/24 1100     Visit Number 4    Date for Recertification  07/27/24    Authorization Type [employer]IC[employer] CHO[employer] PPO    Authorization Time Period submitting new auth - not seen in 3 months    Progress Note Due on Visit 10    PT Start Time 1100    PT Stop Time 1140    PT Time Calculation (min) 40 min    Activity Tolerance Patient tolerated treatment well    Behavior During Therapy WFL for tasks assessed/performed           Past Medical History:  Diagnosis Date   Clubbed toes, right    GERD (gastroesophageal reflux disease)    Hypertension    Past Surgical History:  Procedure Laterality Date   ABDOMINAL HYSTERECTOMY     ANAL RECTAL MANOMETRY N/A 10/09/2023   Procedure: MANOMETRY, ANORECTAL;  Surgeon: Shila Gustav GAILS, MD;  Location: [facility];  Service: Gastroenterology;  Laterality: N/A;   BALLOON DILATION N/A 02/26/2020   Procedure: BALLOON DILATION;  Surgeon: Cindie Carlin POUR, DO;  Location: [facility];  Service: Endoscopy;  Laterality: N/A;   BIOPSY  02/26/2020   Procedure: BIOPSY;  Surgeon: Cindie Carlin POUR, DO;  Location: [facility];  Service: Endoscopy;;   BIOPSY  07/29/2020   Procedure: BIOPSY;  Surgeon: Cindie Carlin POUR, DO;  [facility]: [facility];  Service: Endoscopy;;   COLONOSCOPY WITH PROPOFOL  N/A 02/26/2020   Procedure: COLONOSCOPY WITH PROPOFOL ;  Surgeon: Cindie Carlin POUR, DO;  Location: [facility];  Service: Endoscopy;  Laterality: N/A;  11:00am   ESOPHAGOGASTRODUODENOSCOPY (EGD) WITH PROPOFOL  N/A 02/26/2020   Procedure: ESOPHAGOGASTRODUODENOSCOPY (EGD) WITH PROPOFOL ;  Surgeon: Cindie Carlin POUR, DO;  Location: [facility];  Service: Endoscopy;  Laterality: N/A;   ESOPHAGOGASTRODUODENOSCOPY (EGD) WITH PROPOFOL  N/A 07/29/2020   Procedure:  ESOPHAGOGASTRODUODENOSCOPY (EGD) WITH PROPOFOL ;  Surgeon: Cindie Carlin POUR, DO;  Location: [facility];  Service: Endoscopy;  Laterality: N/A;  10:30am   FOOT SURGERY Right    NOSE SURGERY     Patient Active Problem List   Diagnosis Date Noted   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 01/23/2024   Dyssynergic defecation 01/23/2024   Esophagitis, [place] grade A 05/04/2020   Gastritis 05/04/2020   Hiatal hernia 01/14/2020   Dyspepsia 01/14/2020   Colon cancer screening 01/14/2020   HEPATITIS, CHRONIC PERSISTENT 07/17/2010   [place] SPOTTED FEVER 05/15/2010   Essential hypertension 05/15/2010   CHRONIC FATIGUE SYNDROME 05/15/2010   PALPITATIONS, RECURRENT 05/15/2010   HOT FLASHES 03/13/2010   MYALGIA 03/13/2010   PCP: Rosamond Leta NOVAK, MD  REFERRING PROVIDER: Cindie Carlin POUR, DO  REFERRING DIAG: [phone] (ICD-10-CM) - Dyssynergic defecation  THERAPY DIAG:  Other lack of coordination  Muscle weakness (generalized)  Other muscle spasm  Rationale for Evaluation and Treatment: Rehabilitation  ONSET DATE: 2001  SUBJECTIVE:  SUBJECTIVE STATEMENT: Pt states that she hurt her Lt knee and is in physical therapy for that, but this is also causing her hip pain.   She has been working on bowel mobilization. She is having ropey bowel movements. It takes 3x each morning to feel like she has emptied completely. She feels more uncontrolled urgency in the morning to have her first bowel movement, but she will not leak.     From eval: Pt reports that she told her doctor that she had restricted bowel before her last colonoscopy and he saw internal inflamed hemorrhoids. She stated that she is lucky if she gets a bowel movement as narrow as her pinky, She gets bloated, feels pressure build up, has to  go 3-4 times in the morning, lots of gas. Never feels completely empty.  When he did an exam he saw an evidence of something restricted. Pt could not pass the balloon thing after.  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 She reports that she feels like something else could be going on, she does not like to be a fatalist, but she feels like something is going on. She feels like she needs to have an ultrasound.  Feels like something hormonal is going on.  Pt recently widowed.  She has been eating rice and chicken and has type 4 bristol stool scale  Fluid intake: water , coffee, soda when travelling, milk  PAIN:  Are you having pain? Yes in her heel after a lot of yard work   PRECAUTIONS: None  RED FLAGS: None   WEIGHT BEARING RESTRICTIONS: No  FALLS:  Has patient fallen in last 6 months? No  OCCUPATION: retired, has an health visitor at [employer]  ACTIVITY LEVEL : house and a lot of yardwork, antique booth  PLOF: Independent  PATIENT GOALS: to have more normal bowel movements like before  PERTINENT HISTORY: see above   Sexual abuse: No  BOWEL MOVEMENT: has a bowel movement every day Pain with bowel movement: No Type of bowel movement:Type ([place] Stool Scale) 4-7 Fully empty rectum: No Leakage: No Pads: yes Fiber supplement/laxative Yes metamucil chewies-2 /day  URINATION: Pain with urination: No Fully empty bladder: No Stream: Strong Urgency: No Frequency: no Leakage: Sneezing and pressure on abdomen, coughing, lifting something heavy Pads: Yes:    INTERCOURSE: not active   PREGNANCY: no children  PROLAPSE: None   OBJECTIVE:  Note: Objective measures were completed at Evaluation unless otherwise noted.  05/04/24:  PFIQ-7: 10  Patient confirms identification and approves PT to assess internal pelvic floor and treatment yes  No emotional/communication barriers or cognitive  limitation. Patient is motivated to learn. Patient understands and agrees with treatment goals and plan. PT explains patient will be examined in standing, sitting, and lying down to see how their muscles and joints work. When they are ready, they will be asked to remove their underwear so PT can examine their perineum. The patient is also given the option of providing their own chaperone as one is not provided in our facility. The patient also has the right and is explained the right to defer or refuse any part of the evaluation or treatment including the internal exam. With the patient's consent, PT will use one gloved finger to gently assess the muscles of the pelvic floor, seeing how well it contracts and relaxes and if there is muscle symmetry. After, the patient will get  dressed and PT and patient will discuss exam findings and plan of care. PT and patient discuss plan of care, schedule, attendance policy and HEP activities.      PELVIC MMT:   MMT eval  Internal Anal Sphincter 4/5  External Anal Sphincter 4/5  Puborectalis 4/5, 5 second hold, 2 repeat contractions  (Blank rows = not tested)  INTERNAL RECTAL EXAM: -able to increase internal rectal pressure, external anal sphincter co-contraction at the same time  TONE: High - external anal sphincter most severe, slightly better at puborectalis  EVAL: DIAGNOSTIC FINDINGS:  Balloon manometry   PATIENT SURVEYS:    PFIQ-7: 10  COGNITION: Overall cognitive status: Within functional limits for tasks assessed     SENSATION: Light touch: Appears intact  LUMBAR SPECIAL TESTS:  Single leg stance test: Positive bilateral knee valgus  GAIT: Assistive device utilized: None Comments: antalgic   POSTURE: rounded shoulders, forward head, and increased thoracic kyphosis   LUMBARAROM/PROM: within functional limitations    LOWER EXTREMITY ROM: within functional limitations    LOWER EXTREMITY MMT: 4/5 bilateral hips PALPATION:    General: upper chest breathing strategies, tight and tender large abdominal scar  Pelvic Alignment: even  Abdominal: restricted abdominal scar                External Perineal Exam: dry and decreased estrogen                Internal Pelvic Floor: tight and tender vaginally and rectally  Patient confirms identification and approves PT to assess internal pelvic floor and treatment yes Patient confirms identification and approves PT to assess internal pelvic floor and treatment Yes No emotional/communication barriers or cognitive limitation. Patient is motivated to learn. Patient understands and agrees with treatment goals and plan. PT explains patient will be examined in standing, sitting, and lying down to see how their muscles and joints work. When they are ready, they will be asked to remove their underwear so PT can examine their perineum. The patient is also given the option of providing their own chaperone as one is not provided in our facility. The patient also has the right and is explained the right to defer or refuse any part of the evaluation or treatment including the internal exam. With the patient's consent, PT will use one gloved finger to gently assess the muscles of the pelvic floor, seeing how well it contracts and relaxes and if there is muscle symmetry. After, the patient will get dressed and PT and patient will discuss exam findings and plan of care. PT and patient discuss plan of care, schedule, attendance policy and HEP activities.      PELVIC MMT:   MMT eval  Vaginal 4/5  Internal Anal Sphincter 5/5  External Anal Sphincter 5/5  Puborectalis   Diastasis Recti 2 fingers and at below umbilicus, some doming present with curl up  (Blank rows = not tested)        TONE: high  PROLAPSE: Unable to assess due to tenderness   TODAY'S TREATMENT:  DATE: 05/04/24 RE-EVAL/auth submitted Neuromuscular re-education: Pt provides verbal consent for internal rectal pelvic floor exam. Rectal pelvic floor muscle reassessment External anal sphincter relaxation training while pushing/increasing intra-rectal pressure 5x Therapeutic activities: Fiber review Water  intake review Toilet position review HEP review   01/28/2024 Manual - abdominal scar massage performed and PT educated on Diaphragmatic breathing 6 breaths HS with strap 2 x30 secs Single knee to chest 2x30s right only due to left knee pain Yoga ball perineal massage seated Deep breathing reed with thera band around lower ribs on yoga ball with pelvic floor drop Education on relevant anatomy, down training, rationale for down training, new HEP provided    01/14/2024 Nu step 15 mins with therapist present to discuss progress Bowel diary review Education on healthy bowel habits and fiber Internal pelvic floor muscle assessment    PATIENT EDUCATION:  Education details: Pt was educated on relevant anatomy, exam findings, HEP, expectations of PT   Person educated: Patient Education method: Explanation, Demonstration, Tactile cues, Verbal cues, and Handouts Education comprehension: verbalized understanding, returned demonstration, verbal cues required, tactile cues required, and needs further education  HOME EXERCISE PROGRAM: Access Code: [email or web address] URL: [email or web address] Date: 11/14/2023 Prepared by: Cori Helmus  Exercises - Diaphragmatic Breathing to Reduce Intra-abdominal Pressure: Sit to Stand  - 1 x daily - 7 x weekly - 3 sets - 10 reps - Diaphragmatic Breathing to Reduce Intra-abdominal Pressure: Lifting a Basket  - 1 x daily - 7 x weekly - 3 sets - 10 reps - Seated Cough with Pelvic Floor Contraction and Hand to Mouth  - 1 x daily - 7 x weekly - 3 sets - 10 reps  Patient Education - Get To Know Your Pelvic Floor- Female - High-Fiber Diet to  Support Pelvic Health - Abdominal Massage for Constipation - Bowel Emptying Techniques  ASSESSMENT:  CLINICAL IMPRESSION: Pt is a 73 year old female present today for re-evaluation of abnormal bowel movements. Signs and symptoms are most consistent with dyssynergic defecation based upon rectal assessment of pelvic floor this session. She is able to increase intra-rectal pressure appropriately, but contracts external anal sphincter at the same time, causing need for further increase in pressure to press out thin bowel movements. This is likely also contributing to incomplete emptying and repeat bowel movements. Believe that at this time, her symptoms are not consistent with stool burden and overflow diarrhea, but we will continue working assessment; due to this, believe it would be more appropriate to talk with MD about fiber supplement for bulk building specifically vs Miralax to further soften bowel movements. She was able to practice exhale with pushing/lengthening utilizing internal rectal feedback today with some good improvements. She was encouraged to practice this while trying to have bowel movements this week to see if her symptoms change. She also continues to demonstrate high tone in her pelvic floor muscles; we will continue working on down training to help improve. Pt has not been to see pelvic floor PT in about 3 months due to poor experience, scheduling difficulties, and injuring knee; she is prepared to focus on treatment at this time. Patient is progressing slowly towards goals and will benefit from continued PT to address deficits, reduce gas and diarrhea and improve quality of life.      From eval Pt 10 mins late for her eval, She lives 45 mins away.Patient is a 73 y.o. F who was seen today for physical therapy evaluation and treatment for diarrhea. Pt with diastasis rectus, tender and restricted  abdominal scar,decreased awareness of her core and pelvic floor, seems anxious and a little   disoriented. She will get a squatty potty and complete complete a 4-5 day bowel diary to identify bowel irritants at this point. She has had chronic diarrhea for 5 years. Pt mentioned stress urinary incontinence and feels like she would like to address that. 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 She will benefit from PT when she returns in August.   OBJECTIVE IMPAIRMENTS: decreased balance, decreased cognition, decreased coordination, decreased endurance, difficulty walking, decreased strength, increased fascial restrictions, improper body mechanics, and pain.   ACTIVITY LIMITATIONS: continence and toileting  PARTICIPATION LIMITATIONS: community activity  PERSONAL FACTORS: Behavior pattern and Time since onset of injury/illness/exacerbation are also affecting patient's functional outcome.   REHAB POTENTIAL: Good  CLINICAL DECISION MAKING: Evolving/moderate complexity  EVALUATION COMPLEXITY: Moderate   GOALS: Goals reviewed with patient? Yes  SHORT TERM GOALS: Target date:  12/12/2023      Pt will be independent with HEP.   Baseline: Goal status: MET 05/04/24  2.  Pt will be independent with use of squatty potty, relaxed toileting mechanics, and improved bowel movement techniques in order to increase ease of bowel movements and complete evacuation.   Baseline:  Goal status: MET 05/04/24   3.  Pt will be independent with the knack, urge suppression technique, and double voiding in order to improve bladder habits and decrease urinary incontinence.   Baseline:  Goal status: IN PROGRESS 12/1/205   LONG TERM GOALS: Target date: 02/06/2024 ( pt starting in August)  Pt will report her BMs are complete due to improved bowel habits and evacuation techniques.  Baseline: feeling more complete, but takes three consecutive trips to the bathroom to feel empty Goal status: IN PROGRESS 12/1/205  2.  Pt will report no episodes  of urinary or fecal incontinence in order to improve confidence in community activities and personal hygiene.   Baseline: patient still having issues  Goal status: IN PROGRESS 12/1/205  3.  Pt will report type 3-4 bristol stool scale consistently Baseline: continue to be 5-6 consistency Goal status: IN PROGRESS 12/1/205  4.  Pt will be able to have a bowel movement within 2-3 minutes so she is not straining and adding pressure on the pelvic floor due to the ability to lengthen the tissue and generate  pressure to push the stool out  Baseline: pt having faster bowel movements in this time frame, but needs to have 3 to completely empty Goal status: MET 05/04/24  5.   Pt will be able to dem 20 squats and 20 jumps without leaking to participate in a regular exercise program  Baseline: has not been able to actively work on this due to knee pain Goal status: IN PROGRESS 12/1/205   PLAN:  PT FREQUENCY: 1-2x/week  PT DURATION: 12 weeks  PLANNED INTERVENTIONS: 97110-Therapeutic exercises, 97530- Therapeutic activity, 97112- Neuromuscular re-education, 97535- Self Care, 02859- Manual therapy, [phone]- Electrical stimulation (manual), 20560 (1-2 muscles), 20561 (3+ muscles)- Dry Needling, Patient/Family education, Taping, Joint mobilization, Joint manipulation, Spinal manipulation, Spinal mobilization, Scar mobilization, Cryotherapy, Moist heat, and Biofeedback  PLAN FOR NEXT SESSION: abdominal massage, scar massage, downtraining, rectal manual work; work on exhale with pushing/lengthening external anal sphincter     Josette Mares, PT, DPT12/06/2510:58 PM [facility] [address], [address] [place], [place] [address] Phone # [phone] Fax [phone]

## 2024-05-05 DIAGNOSIS — M25562 Pain in left knee: Secondary | ICD-10-CM | POA: Diagnosis not present

## 2024-05-05 DIAGNOSIS — G8929 Other chronic pain: Secondary | ICD-10-CM | POA: Diagnosis not present

## 2024-05-07 DIAGNOSIS — G8929 Other chronic pain: Secondary | ICD-10-CM | POA: Diagnosis not present

## 2024-05-07 DIAGNOSIS — M25562 Pain in left knee: Secondary | ICD-10-CM | POA: Diagnosis not present

## 2024-06-03 ENCOUNTER — Encounter: Payer: Self-pay | Admitting: Internal Medicine

## 2024-06-29 ENCOUNTER — Ambulatory Visit

## 2024-06-30 ENCOUNTER — Telehealth: Payer: Self-pay | Admitting: Physical Therapy

## 2024-06-30 NOTE — Telephone Encounter (Signed)
 Called patient to reschedule appointment from 1/26. Voicemail left.   Thank you,  Darryle Navy, PT, DPT 06/30/2608:45 AM  [facility] [address], [address] [place], [place] [address] Phone # [phone] Fax [phone]

## 2024-07-09 ENCOUNTER — Telehealth: Payer: Self-pay | Admitting: Gastroenterology

## 2024-07-09 NOTE — Telephone Encounter (Signed)
 noted

## 2024-07-09 NOTE — Telephone Encounter (Signed)
 Patient states she received a follow up letter for 3 months she does not want to make that appointment at this time. Patient also states she is going through complications with other health issues right now. She may make an appointment at another time.

## 2024-07-21 ENCOUNTER — Ambulatory Visit
